# Patient Record
Sex: Female | Born: 1990 | Race: Black or African American | Hispanic: No | Marital: Single | Smoking: Never smoker
Health system: Southern US, Community
[De-identification: ages and names within clinical notes are randomized; demographics above are authoritative.]

---

## 2013-10-19 ENCOUNTER — Emergency Department (HOSPITAL_BASED_OUTPATIENT_CLINIC_OR_DEPARTMENT_OTHER)
Admission: EM | Admit: 2013-10-19 | Discharge: 2013-10-19 | Disposition: A | Discharge status: Home / Self Care | Payer: Self-pay | Attending: Emergency Medicine | Admitting: Emergency Medicine

## 2013-10-19 ENCOUNTER — Encounter (HOSPITAL_BASED_OUTPATIENT_CLINIC_OR_DEPARTMENT_OTHER): Payer: Self-pay | Admitting: Emergency Medicine

## 2013-10-19 DIAGNOSIS — K920 Hematemesis: Secondary | ICD-10-CM | POA: Insufficient documentation

## 2013-10-19 DIAGNOSIS — M545 Low back pain, unspecified: Secondary | ICD-10-CM | POA: Insufficient documentation

## 2013-10-19 DIAGNOSIS — R1110 Vomiting, unspecified: Secondary | ICD-10-CM

## 2013-10-19 DIAGNOSIS — Z3202 Encounter for pregnancy test, result negative: Secondary | ICD-10-CM | POA: Insufficient documentation

## 2013-10-19 LAB — URINALYSIS, ROUTINE W REFLEX MICROSCOPIC
Bilirubin Urine: NEGATIVE
Glucose, UA: NEGATIVE mg/dL
Hgb urine dipstick: NEGATIVE
Ketones, ur: NEGATIVE mg/dL
Leukocytes, UA: NEGATIVE
Nitrite: NEGATIVE
Protein, ur: NEGATIVE mg/dL
Specific Gravity, Urine: 1.012 (ref 1.005–1.030)
pH: 6.5 (ref 5.0–8.0)

## 2013-10-19 LAB — PREGNANCY, URINE: Preg Test, Ur: NEGATIVE

## 2013-10-19 MED ORDER — PANTOPRAZOLE SODIUM 40 MG PO TBEC
40.0000 mg | DELAYED_RELEASE_TABLET | Freq: Once | ORAL | Status: AC
  Administered 2013-10-19: 40 mg via ORAL
  Filled 2013-10-19: qty 1

## 2013-10-19 MED ORDER — PANTOPRAZOLE SODIUM 40 MG PO TBEC: 40.0000 mg | DELAYED_RELEASE_TABLET | Freq: Every day | ORAL | Status: DC

## 2013-10-19 MED ORDER — ONDANSETRON 4 MG PO TBDP
4.0000 mg | ORAL_TABLET | Freq: Once | ORAL | Status: AC
  Administered 2013-10-19: 4 mg via ORAL
  Filled 2013-10-19: qty 1

## 2013-10-19 MED ORDER — ONDANSETRON 4 MG PO TBDP: 4.0000 mg | ORAL_TABLET | Freq: Three times a day (TID) | ORAL | Status: DC | PRN

## 2013-10-19 NOTE — ED Notes (Addendum)
Vomited x2 at work at 5am this morning.  Denies abd pain.  Vomited last at 7am. Has not eaten. Sts after she vomited she felt better.

## 2013-10-19 NOTE — ED Provider Notes (Signed)
CSN: [id number]     Arrival date & time 10/19/13  1028 History   First MD Initiated Contact with Patient 10/19/13 1031     Chief Complaint  Patient presents with  . Emesis   (Consider location/radiation/quality/duration/timing/severity/associated sxs/prior Treatment) HPI Comments: Has been taking NSAIDs twice daily for the past month for her lower back problems.  Patient is a 22 y.o. female presenting with vomiting. The history is provided by the patient.  Emesis Severity:  Mild Timing:  Sporadic Number of daily episodes:  2 Quality:  Stomach contents and bright red blood (small amount of bright red blood with each episode of vomiting today) Progression:  Resolved Chronicity:  New Recent urination:  Normal Context: not post-tussive and not self-induced   Relieved by:  Nothing Worsened by:  Nothing tried Associated symptoms: no abdominal pain, no chills, no cough, no diarrhea, no fever, no myalgias, no sore throat and no URI   Risk factors: no alcohol use, no sick contacts and no suspect food intake     History reviewed. No pertinent past medical history. History reviewed. No pertinent past surgical history. No family history on file. History  Substance Use Topics  . Smoking status: Never Smoker   . Smokeless tobacco: Not on file  . Alcohol Use: Yes     Comment: occ   OB History   Grav Para Term Preterm Abortions TAB SAB Ect Mult Living                 Review of Systems  Constitutional: Negative for chills.  HENT: Negative for sore throat.   Gastrointestinal: Positive for vomiting. Negative for abdominal pain and diarrhea.  Musculoskeletal: Negative for myalgias.  All other systems reviewed and are negative.    Allergies  Review of patient's allergies indicates no known allergies.  Home Medications  No current outpatient prescriptions on file. BP 145/83  Pulse 68  Temp(Src) 98 F (36.7 C) (Oral)  Resp 16  Ht 5\' 4"  (1.626 m)  Wt 180 lb (81.647 kg)  BMI  30.88 kg/m2  SpO2 100%  LMP 10/13/2013 Physical Exam  Nursing note and vitals reviewed. Constitutional: She is oriented to person, place, and time. She appears well-developed and well-nourished. No distress.  HENT:  Head: Normocephalic and atraumatic.  Eyes: EOM are normal. Pupils are equal, round, and reactive to light.  Neck: Normal range of motion. Neck supple.  Cardiovascular: Normal rate and regular rhythm.  Exam reveals no friction rub.   No murmur heard. Pulmonary/Chest: Effort normal and breath sounds normal. No respiratory distress. She has no wheezes. She has no rales.  Abdominal: Soft. She exhibits no distension. There is no tenderness. There is no rebound.  Musculoskeletal: Normal range of motion. She exhibits no edema.  Neurological: She is alert and oriented to person, place, and time.  Skin: She is not diaphoretic.    ED Course  Procedures (including critical care time) Labs Review Labs Reviewed  PREGNANCY, URINE  URINALYSIS, ROUTINE W REFLEX MICROSCOPIC   Imaging Review No results found.  EKG Interpretation   None       MDM   1. Vomiting   2. Hematemesis    22 year old female presents with 2 episodes of vomiting. She has small amount of bright red blood in each episode of vomiting. She's feeling better now. It came on out of nowhere all she was working. She's been taking NSAIDs multiple times daily for the past month due to lower back pain. Here her vitals are  stable. She is in normal exam. She denies any abdominal pain. Her lungs are clear and her chest wall is without any subcutaneous emphysema. I believe patient's mild hematemesis secondary to gastritis from NSAID use. I will give her Zofran here and a two-week prescription for Protonix. Also advised her to lay off the NSAIDs while she's taking the Protonix and switch to Tylenol.    Dagmar Hait, MD 10/19/13 1224

## 2014-03-17 ENCOUNTER — Encounter (HOSPITAL_BASED_OUTPATIENT_CLINIC_OR_DEPARTMENT_OTHER): Payer: Self-pay | Admitting: Emergency Medicine

## 2014-03-17 ENCOUNTER — Emergency Department (HOSPITAL_BASED_OUTPATIENT_CLINIC_OR_DEPARTMENT_OTHER)
Admission: EM | Admit: 2014-03-17 | Discharge: 2014-03-17 | Disposition: A | Discharge status: Home / Self Care | Payer: Self-pay | Attending: Emergency Medicine | Admitting: Emergency Medicine

## 2014-03-17 DIAGNOSIS — N939 Abnormal uterine and vaginal bleeding, unspecified: Secondary | ICD-10-CM

## 2014-03-17 DIAGNOSIS — J069 Acute upper respiratory infection, unspecified: Secondary | ICD-10-CM | POA: Insufficient documentation

## 2014-03-17 DIAGNOSIS — Z3202 Encounter for pregnancy test, result negative: Secondary | ICD-10-CM | POA: Insufficient documentation

## 2014-03-17 DIAGNOSIS — N898 Other specified noninflammatory disorders of vagina: Secondary | ICD-10-CM | POA: Insufficient documentation

## 2014-03-17 LAB — URINALYSIS, ROUTINE W REFLEX MICROSCOPIC
Glucose, UA: NEGATIVE mg/dL
KETONES UR: 15 mg/dL — AB
LEUKOCYTES UA: NEGATIVE
Nitrite: NEGATIVE
PROTEIN: NEGATIVE mg/dL
Specific Gravity, Urine: 1.031 — ABNORMAL HIGH (ref 1.005–1.030)
Urobilinogen, UA: 1 mg/dL (ref 0.0–1.0)
pH: 5.5 (ref 5.0–8.0)

## 2014-03-17 LAB — WET PREP, GENITAL
TRICH WET PREP: NONE SEEN
YEAST WET PREP: NONE SEEN

## 2014-03-17 LAB — PREGNANCY, URINE: Preg Test, Ur: NEGATIVE

## 2014-03-17 LAB — URINE MICROSCOPIC-ADD ON

## 2014-03-17 NOTE — Discharge Instructions (Signed)
Dysfunctional Uterine Bleeding Normally, menstrual periods begin between ages 16 to 68 in young women. A normal menstrual cycle/period may begin every 23 days up to 35 days and lasts from 1 to 7 days. Around 12 to 14 days before your menstrual period starts, ovulation (ovary produces an egg) occurs. When counting the time between menstrual periods, count from the first day of bleeding of the previous period to the first day of bleeding of the next period. Dysfunctional (abnormal) uterine bleeding is bleeding that is different from a normal menstrual period. Your periods may come earlier or later than usual. They may be lighter, have blood clots or be heavier. You may have bleeding between periods, or you may skip one period or more. You may have bleeding after sexual intercourse, bleeding after menopause, or no menstrual period. CAUSES   Pregnancy (normal, miscarriage, tubal).  IUDs (intrauterine device, birth control).  Birth control pills.  Hormone treatment.  Menopause.  Infection of the cervix.  Blood clotting problems.  Infection of the inside lining of the uterus.  Endometriosis, inside lining of the uterus growing in the pelvis and other female organs.  Adhesions (scar tissue) inside the uterus.  Obesity or severe weight loss.  Uterine polyps inside the uterus.  Cancer of the vagina, cervix, or uterus.  Ovarian cysts or polycystic ovary syndrome.  Medical problems (diabetes, thyroid disease).  Uterine fibroids (noncancerous tumor).  Problems with your female hormones.  Endometrial hyperplasia, very thick lining and enlarged cells inside of the uterus.  Medicines that interfere with ovulation.  Radiation to the pelvis or abdomen.  Chemotherapy. DIAGNOSIS   Your doctor will discuss the history of your menstrual periods, medicines you are taking, changes in your weight, stress in your life, and any medical problems you may have.  Your doctor will do a physical  and pelvic examination.  Your doctor may want to perform certain tests to make a diagnosis, such as:  Pap test.  Blood tests.  Cultures for infection.  CT scan.  Ultrasound.  Hysteroscopy.  Laparoscopy.  MRI.  Hysterosalpingography.  D and C.  Endometrial biopsy. TREATMENT  Treatment will depend on the cause of the dysfunctional uterine bleeding (DUB). Treatment may include:  Observing your menstrual periods for a couple of months.  Prescribing medicines for medical problems, including:  Antibiotics.  Hormones.  Birth control pills.  Removing an IUD (intrauterine device, birth control).  Surgery:  D and C (scrape and remove tissue from inside the uterus).  Laparoscopy (examine inside the abdomen with a lighted tube).  Uterine ablation (destroy lining of the uterus with electrical current, laser, heat, or freezing).  Hysteroscopy (examine cervix and uterus with a lighted tube).  Hysterectomy (remove the uterus). HOME CARE INSTRUCTIONS   If medicines were prescribed, take exactly as directed. Do not change or switch medicines without consulting your caregiver.  Long term heavy bleeding may result in iron deficiency. Your caregiver may have prescribed iron pills. They help replace the iron that your body lost from heavy bleeding. Take exactly as directed.  Do not take aspirin or medicines that contain aspirin one week before or during your menstrual period. Aspirin may make the bleeding worse.  If you need to change your sanitary pad or tampon more than once every 2 hours, stay in bed with your feet elevated and a cold pack on your lower abdomen. Rest as much as possible, until the bleeding stops or slows down.  Eat well-balanced meals. Eat foods high in iron. Examples  are:  Leafy green vegetables.  Whole-grain breads and cereals.  Eggs.  Meat.  Liver.  Do not try to lose weight until the abnormal bleeding has stopped and your blood iron level is  back to normal. Do not lift more than ten pounds or do strenuous activities when you are bleeding.  For a couple of months, make note on your calendar, marking the start and ending of your period, and the type of bleeding (light, medium, heavy, spotting, clots or missed periods). This is for your caregiver to better evaluate your problem. SEEK MEDICAL CARE IF:   You develop nausea (feeling sick to your stomach) and vomiting, dizziness, or diarrhea while you are taking your medicine.  You are getting lightheaded or weak.  You have any problems that may be related to the medicine you are taking.  You develop pain with your DUB.  You want to remove your IUD.  You want to stop or change your birth control pills or hormones.  You have any type of abnormal bleeding mentioned above.  You are over 25 years old and have not had a menstrual period yet.  You are 23 years old and you are still having menstrual periods.  You have any of the symptoms mentioned above.  You develop a rash. SEEK IMMEDIATE MEDICAL CARE IF:   An oral temperature above 102 F (38.9 C) develops.  You develop chills.  You are changing your sanitary pad or tampon more than once an hour.  You develop abdominal pain.  You pass out or faint. Document Released: 10/25/2000 Document Revised: 01/20/2012 Document Reviewed: 09/26/2009 [facility] Patient Information 2014 [place]ExitCare, Maryland[place].  Upper Respiratory Infection, Adult An upper respiratory infection (URI) is also sometimes known as the common cold. The upper respiratory tract includes the nose, sinuses, throat, trachea, and bronchi. Bronchi are the airways leading to the lungs. Most people improve within 1 week, but symptoms can last up to 2 weeks. A residual cough may last even longer.  CAUSES Many different viruses can infect the tissues lining the upper respiratory tract. The tissues become irritated and inflamed and often become very moist. Mucus production is also  common. A cold is contagious. You can easily spread the virus to others by oral contact. This includes kissing, sharing a glass, coughing, or sneezing. Touching your mouth or nose and then touching a surface, which is then touched by another person, can also spread the virus. SYMPTOMS  Symptoms typically develop 1 to 3 days after you come in contact with a cold virus. Symptoms vary from person to person. They may include:  Runny nose.  Sneezing.  Nasal congestion.  Sinus irritation.  Sore throat.  Loss of voice (laryngitis).  Cough.  Fatigue.  Muscle aches.  Loss of appetite.  Headache.  Low-grade fever. DIAGNOSIS  You might diagnose your own cold based on familiar symptoms, since most people get a cold 2 to 3 times a year. Your caregiver can confirm this based on your exam. Most importantly, your caregiver can check that your symptoms are not due to another disease such as strep throat, sinusitis, pneumonia, asthma, or epiglottitis. Blood tests, throat tests, and X-rays are not necessary to diagnose a common cold, but they may sometimes be helpful in excluding other more serious diseases. Your caregiver will decide if any further tests are required. RISKS AND COMPLICATIONS  You may be at risk for a more severe case of the common cold if you smoke cigarettes, have chronic heart disease (such  as heart failure) or lung disease (such as asthma), or if you have a weakened immune system. The very young and very old are also at risk for more serious infections. Bacterial sinusitis, middle ear infections, and bacterial pneumonia can complicate the common cold. The common cold can worsen asthma and chronic obstructive pulmonary disease (COPD). Sometimes, these complications can require emergency medical care and may be life-threatening. PREVENTION  The best way to protect against getting a cold is to practice good hygiene. Avoid oral or hand contact with people with cold symptoms. Wash your  hands often if contact occurs. There is no clear evidence that vitamin C, vitamin E, echinacea, or exercise reduces the chance of developing a cold. However, it is always recommended to get plenty of rest and practice good nutrition. TREATMENT  Treatment is directed at relieving symptoms. There is no cure. Antibiotics are not effective, because the infection is caused by a virus, not by bacteria. Treatment may include:  Increased fluid intake. Sports drinks offer valuable electrolytes, sugars, and fluids.  Breathing heated mist or steam (vaporizer or shower).  Eating chicken soup or other clear broths, and maintaining good nutrition.  Getting plenty of rest.  Using gargles or lozenges for comfort.  Controlling fevers with ibuprofen or acetaminophen as directed by your caregiver.  Increasing usage of your inhaler if you have asthma. Zinc gel and zinc lozenges, taken in the first 24 hours of the common cold, can shorten the duration and lessen the severity of symptoms. Pain medicines may help with fever, muscle aches, and throat pain. A variety of non-prescription medicines are available to treat congestion and runny nose. Your caregiver can make recommendations and may suggest nasal or lung inhalers for other symptoms.  HOME CARE INSTRUCTIONS   Only take over-the-counter or prescription medicines for pain, discomfort, or fever as directed by your caregiver.  Use a warm mist humidifier or inhale steam from a shower to increase air moisture. This may keep secretions moist and make it easier to breathe.  Drink enough water and fluids to keep your urine clear or pale yellow.  Rest as needed.  Return to work when your temperature has returned to normal or as your caregiver advises. You may need to stay home longer to avoid infecting others. You can also use a face mask and careful hand washing to prevent spread of the virus. SEEK MEDICAL CARE IF:   After the first few days, you feel you are  getting worse rather than better.  You need your caregiver's advice about medicines to control symptoms.  You develop chills, worsening shortness of breath, or brown or red sputum. These may be signs of pneumonia.  You develop yellow or brown nasal discharge or pain in the face, especially when you bend forward. These may be signs of sinusitis.  You develop a fever, swollen neck glands, pain with swallowing, or white areas in the back of your throat. These may be signs of strep throat. SEEK IMMEDIATE MEDICAL CARE IF:   You have a fever.  You develop severe or persistent headache, ear pain, sinus pain, or chest pain.  You develop wheezing, a prolonged cough, cough up blood, or have a change in your usual mucus (if you have chronic lung disease).  You develop sore muscles or a stiff neck. Document Released: 04/23/2001 Document Revised: 01/20/2012 Document Reviewed: 03/01/2011 [facility]ExitCare Patient Information 2014 [place]idgeExitCare, MarylandLLC.

## 2014-03-17 NOTE — ED Notes (Signed)
Vaginal bleeding for 2 months.

## 2014-03-17 NOTE — ED Provider Notes (Signed)
CSN: [id number]     Arrival date & time 03/17/14  1450 History   First MD Initiated Contact with Patient 03/17/14 1649     Chief Complaint  Patient presents with  . Vaginal Bleeding     (Consider location/radiation/quality/duration/timing/severity/associated sxs/prior Treatment) Patient is a 23 y.o. female presenting with vaginal bleeding.  Vaginal Bleeding Quality:  Clots and dark red Severity:  Moderate Onset quality:  Gradual Duration:  2 months Timing:  Constant Progression:  Unchanged Chronicity:  Recurrent Menstrual history:  Irregular Number of tampons used:  2 per day Context: spontaneously   Relieved by:  Nothing Worsened by:  Nothing tried Associated symptoms: no abdominal pain, no dysuria, no fever, no nausea and no vaginal discharge     History reviewed. No pertinent past medical history. History reviewed. No pertinent past surgical history. No family history on file. History  Substance Use Topics  . Smoking status: Never Smoker   . Smokeless tobacco: Not on file  . Alcohol Use: Yes     Comment: occ   OB History   Grav Para Term Preterm Abortions TAB SAB Ect Mult Living                 Review of Systems  Constitutional: Negative for fever.  Gastrointestinal: Negative for nausea and abdominal pain.  Genitourinary: Positive for vaginal bleeding. Negative for dysuria and vaginal discharge.  All other systems reviewed and are negative.     Allergies  Review of patient's allergies indicates no known allergies.  Home Medications   Prior to Admission medications   Medication Sig Start Date End Date Taking? Authorizing Provider  ondansetron (ZOFRAN-ODT) 4 MG disintegrating tablet Take 1 tablet (4 mg total) by mouth every 8 (eight) hours as needed for nausea or vomiting. 10/19/13   Dagmar HaitWilliam Blair Walden, MD  pantoprazole (PROTONIX) 40 MG tablet Take 1 tablet (40 mg total) by mouth daily. 10/19/13   Dagmar HaitWilliam Blair Walden, MD   BP 139/89  Pulse 89  Temp(Src)  99.7 F (37.6 C) (Oral)  Resp 16  Wt 180 lb (81.647 kg)  SpO2 100%  LMP 01/15/2014 Physical Exam  Nursing note and vitals reviewed. Constitutional: She is oriented to person, place, and time. She appears well-developed and well-nourished. No distress.  HENT:  Head: Normocephalic and atraumatic.  Mouth/Throat: Posterior oropharyngeal erythema present. No oropharyngeal exudate, posterior oropharyngeal edema or tonsillar abscesses.  Eyes: Conjunctivae are normal. Pupils are equal, round, and reactive to light. No scleral icterus.  Neck: Neck supple.  Cardiovascular: Normal rate, regular rhythm, normal heart sounds and intact distal pulses.   No murmur heard. Pulmonary/Chest: Effort normal and breath sounds normal. No stridor. No respiratory distress. She has no rales.  Abdominal: Soft. Bowel sounds are normal. She exhibits no distension. There is no tenderness.  Genitourinary: There is no rash or tenderness on the right labia. There is no rash or tenderness on the left labia. Uterus is not enlarged and not tender. Cervix exhibits no motion tenderness, no discharge and no friability. Right adnexum displays no mass and no tenderness. Left adnexum displays no mass and no tenderness. There is bleeding around the vagina. No signs of injury around the vagina. No vaginal discharge found.  Musculoskeletal: Normal range of motion.  Neurological: She is alert and oriented to person, place, and time.  Skin: Skin is warm and dry. No rash noted.  Psychiatric: She has a normal mood and affect. Her behavior is normal.    ED Course  Procedures (including  critical care time) Labs Review Labs Reviewed  WET PREP, GENITAL - Abnormal; Notable for the following:    Clue Cells Wet Prep HPF POC MANY (*)    WBC, Wet Prep HPF POC FEW (*)    All other components within normal limits  URINALYSIS, ROUTINE W REFLEX MICROSCOPIC - Abnormal; Notable for the following:    APPearance CLOUDY (*)    Specific Gravity,  Urine 1.031 (*)    Hgb urine dipstick MODERATE (*)    Bilirubin Urine SMALL (*)    Ketones, ur 15 (*)    All other components within normal limits  URINE MICROSCOPIC-ADD ON - Abnormal; Notable for the following:    Squamous Epithelial / LPF FEW (*)    All other components within normal limits  GC/CHLAMYDIA PROBE AMP  PREGNANCY, URINE    Imaging Review No results found.   EKG Interpretation None      MDM   Final diagnoses:  Vaginal bleeding  Viral URI    Vaginal bleeding for two months without pain.  No signs or symptoms of significant blood loss.  Will refer to gynecology.    Also complains of URI symptoms at discharge to request work note.  Well appearing, mild posterior pharyngeal erythema.  Likely has viral URI.  Advised return precautions.      Candyce ChurnJohn David Tarry Fountain III, MD 03/18/14 Marlyne [email or web address]

## 2014-03-17 NOTE — ED Notes (Signed)
States has had vag bleeding x 2 months. Cycles have always been irregular.  Started having cycles for 2 months in Sept

## 2014-03-18 LAB — GC/CHLAMYDIA PROBE AMP
CT Probe RNA: NEGATIVE
GC Probe RNA: NEGATIVE

## 2016-01-05 ENCOUNTER — Emergency Department (HOSPITAL_BASED_OUTPATIENT_CLINIC_OR_DEPARTMENT_OTHER)
Admission: EM | Admit: 2016-01-05 | Discharge: 2016-01-05 | Disposition: A | Discharge status: Home / Self Care | Payer: Self-pay | Attending: Emergency Medicine | Admitting: Emergency Medicine

## 2016-01-05 ENCOUNTER — Encounter (HOSPITAL_BASED_OUTPATIENT_CLINIC_OR_DEPARTMENT_OTHER): Payer: Self-pay | Admitting: *Deleted

## 2016-01-05 DIAGNOSIS — Z79899 Other long term (current) drug therapy: Secondary | ICD-10-CM | POA: Insufficient documentation

## 2016-01-05 DIAGNOSIS — J029 Acute pharyngitis, unspecified: Secondary | ICD-10-CM | POA: Insufficient documentation

## 2016-01-05 LAB — RAPID STREP SCREEN (MED CTR MEBANE ONLY): Streptococcus, Group A Screen (Direct): NEGATIVE

## 2016-01-05 NOTE — ED Provider Notes (Signed)
CSN: [id number]     Arrival date & time 01/05/16  1659 History   First MD Initiated Contact with Patient 01/05/16 1742     Chief Complaint  Patient presents with  . Sore Throat     (Consider location/radiation/quality/duration/timing/severity/associated sxs/prior Treatment) HPI Cathy Berger is a 25 y.o. female who comes in for evaluation of gradual onset sore throat over the past 4 days. She reports swallowing is painful. She denies any difficulties moving her jaw, cough, fevers, source of breath, chest pain. She does report her brother is sick with similar symptoms. She has tried Chloraseptic spray without relief. No other alleviating or modifying factors.  History reviewed. No pertinent past medical history. History reviewed. No pertinent past surgical history. No family history on file. Social History  Substance Use Topics  . Smoking status: Never Smoker   . Smokeless tobacco: Never Used  . Alcohol Use: No     Comment: occ   OB History    No data available     Review of Systems A 10 point review of systems was completed and was negative except for pertinent positives and negatives as mentioned in the history of present illness     Allergies  Review of patient's allergies indicates no known allergies.  Home Medications   Prior to Admission medications   Medication Sig Start Date End Date Taking? Authorizing Provider  ondansetron (ZOFRAN-ODT) 4 MG disintegrating tablet Take 1 tablet (4 mg total) by mouth every 8 (eight) hours as needed for nausea or vomiting. 10/19/13   Elwin Mocha, MD  pantoprazole (PROTONIX) 40 MG tablet Take 1 tablet (40 mg total) by mouth daily. 10/19/13   Elwin Mocha, MD   BP 135/92 mmHg  Pulse 66  Temp(Src) 98.1 F (36.7 C) (Oral)  Resp 20  Ht  (1.626 m)  Wt 99.791 kg  BMI 37.74 kg/m2  SpO2 100% Physical Exam  Constitutional:  Awake, alert, nontoxic appearance.  HENT:  Head: Atraumatic.  Mildly erythematous posterior oropharynx  with no tonsillar swelling or exudate. No trismus.  Eyes: Right eye exhibits no discharge. Left eye exhibits no discharge.  Neck: Normal range of motion. Neck supple.  Cardiovascular: Normal rate and regular rhythm.   Pulmonary/Chest: Effort normal. She exhibits no tenderness.  Abdominal: Soft. There is no tenderness. There is no rebound.  Musculoskeletal: She exhibits no tenderness.  Baseline ROM, no obvious new focal weakness.  Lymphadenopathy:    She has no cervical adenopathy.  Neurological:  Mental status and motor strength appears baseline for patient and situation.  Skin: No rash noted.  Psychiatric: She has a normal mood and affect.  Nursing note and vitals reviewed.   ED Course  Procedures (including critical care time) Labs Review Labs Reviewed  RAPID STREP SCREEN (NOT AT [facility])  CULTURE, GROUP A STREP [facility])    Imaging Review No results found. I have personally reviewed and evaluated these images and lab results as part of my medical decision-making.   EKG Interpretation None     Meds given in ED:  Medications - No data to display  Discharge Medication List as of 01/05/2016  6:01 PM     Filed Vitals:   01/05/16 1705 01/05/16 1804  BP: 168/97 135/92  Pulse: 74 66  Temp: 98.4 F (36.9 C) 98.1 F (36.7 C)  TempSrc: Oral Oral  Resp: 16 20  Height:  (1.626 m)   Weight: 99.791 kg   SpO2: 100% 100%    MDM  Pt afebrile  without tonsillar exudate, cervical adenopathy. Centor criteria are 1. Diagnosis of viral pharyngitis. No abx indicated. DC w symptomatic tx for pain  Pt does not appear dehydrated, but did discuss importance of water rehydration. Presentation non concerning for PTA or infxn spread to soft tissue. No trismus or uvula deviation. Specific return precautions discussed. Pt able to drink water in ED without difficulty with intact air way. Recommended PCP follow up.  Final diagnoses:  Viral pharyngitis        Joycie Peek,  PA-C 01/05/16 1835  Marily Memos, MD 01/06/16 [phone]

## 2016-01-05 NOTE — Discharge Instructions (Signed)
Your sore throat is likely due to a virus and will resolve on its own over the next week or week and a half. You may try Cepacol lozenges to help with your discomfort. You may also utilize salt water gargles. Follow-up with your doctor as needed. Return to ED for worsening symptoms.  Pharyngitis Pharyngitis is redness, pain, and swelling (inflammation) of your pharynx.  CAUSES  Pharyngitis is usually caused by infection. Most of the time, these infections are from viruses (viral) and are part of a cold. However, sometimes pharyngitis is caused by bacteria (bacterial). Pharyngitis can also be caused by allergies. Viral pharyngitis may be spread from person to person by coughing, sneezing, and personal items or utensils (cups, forks, spoons, toothbrushes). Bacterial pharyngitis may be spread from person to person by more intimate contact, such as kissing.  SIGNS AND SYMPTOMS  Symptoms of pharyngitis include:   Sore throat.   Tiredness (fatigue).   Low-grade fever.   Headache.  Joint pain and muscle aches.  Skin rashes.  Swollen lymph nodes.  Plaque-like film on throat or tonsils (often seen with bacterial pharyngitis). DIAGNOSIS  Your health care provider will ask you questions about your illness and your symptoms. Your medical history, along with a physical exam, is often all that is needed to diagnose pharyngitis. Sometimes, a rapid strep test is done. Other lab tests may also be done, depending on the suspected cause.  TREATMENT  Viral pharyngitis will usually get better in 3-4 days without the use of medicine. Bacterial pharyngitis is treated with medicines that kill germs (antibiotics).  HOME CARE INSTRUCTIONS   Drink enough water and fluids to keep your urine clear or pale yellow.   Only take over-the-counter or prescription medicines as directed by your health care provider:   If you are prescribed antibiotics, make sure you finish them even if you start to feel better.    Do not take aspirin.   Get lots of rest.   Gargle with 8 oz of salt water ( tsp of salt per 1 qt of water) as often as every 1-2 hours to soothe your throat.   Throat lozenges (if you are not at risk for choking) or sprays may be used to soothe your throat. SEEK MEDICAL CARE IF:   You have large, tender lumps in your neck.  You have a rash.  You cough up green, yellow-brown, or bloody spit. SEEK IMMEDIATE MEDICAL CARE IF:   Your neck becomes stiff.  You drool or are unable to swallow liquids.  You vomit or are unable to keep medicines or liquids down.  You have severe pain that does not go away with the use of recommended medicines.  You have trouble breathing (not caused by a stuffy nose). MAKE SURE YOU:   Understand these instructions.  Will watch your condition.  Will get help right away if you are not doing well or get worse.   This information is not intended to replace advice given to you by your health care provider. Make sure you discuss any questions you have with your health care provider.   Document Released: 10/28/2005 Document Revised: 08/18/2013 Document Reviewed: 07/05/2013 Elsevier Interactive Patient Education [employer]

## 2016-01-05 NOTE — ED Notes (Signed)
Sore throat x 4 days- denies fever- painful to swallow

## 2016-01-08 LAB — CULTURE, GROUP A STREP (THRC)

## 2016-07-16 ENCOUNTER — Encounter (HOSPITAL_BASED_OUTPATIENT_CLINIC_OR_DEPARTMENT_OTHER): Payer: Self-pay

## 2016-07-16 ENCOUNTER — Emergency Department (HOSPITAL_BASED_OUTPATIENT_CLINIC_OR_DEPARTMENT_OTHER)
Admission: EM | Admit: 2016-07-16 | Discharge: 2016-07-16 | Disposition: A | Discharge status: Home / Self Care | Payer: Self-pay | Attending: Emergency Medicine | Admitting: Emergency Medicine

## 2016-07-16 DIAGNOSIS — F17210 Nicotine dependence, cigarettes, uncomplicated: Secondary | ICD-10-CM | POA: Insufficient documentation

## 2016-07-16 DIAGNOSIS — N939 Abnormal uterine and vaginal bleeding, unspecified: Secondary | ICD-10-CM | POA: Insufficient documentation

## 2016-07-16 LAB — URINALYSIS, ROUTINE W REFLEX MICROSCOPIC
Bilirubin Urine: NEGATIVE
Glucose, UA: NEGATIVE mg/dL
KETONES UR: NEGATIVE mg/dL
LEUKOCYTES UA: NEGATIVE
Nitrite: NEGATIVE
Protein, ur: NEGATIVE mg/dL
Specific Gravity, Urine: 1.019 (ref 1.005–1.030)
pH: 6.5 (ref 5.0–8.0)

## 2016-07-16 LAB — URINE MICROSCOPIC-ADD ON

## 2016-07-16 LAB — PREGNANCY, URINE: PREG TEST UR: NEGATIVE

## 2016-07-16 LAB — WET PREP, GENITAL
Sperm: NONE SEEN
Trich, Wet Prep: NONE SEEN
Yeast Wet Prep HPF POC: NONE SEEN

## 2016-07-16 MED ORDER — MEDROXYPROGESTERONE ACETATE 5 MG PO TABS: 5.0000 mg | ORAL_TABLET | Freq: Every day | ORAL | 0 refills | Status: DC

## 2016-07-16 MED FILL — MEDROXYPROGESTERONE 5 MG TA: 5 | 5 days supply | Qty: 5 | Fill #0

## 2016-07-16 NOTE — ED Provider Notes (Signed)
[facility]   CSN: [id number] Arrival date & time: 07/16/16  1320     History   Chief Complaint Chief Complaint  Patient presents with  . Vaginal Bleeding    HPI Cathy Berger is a 25 y.o. female.  The history is provided by the patient.  Vaginal Bleeding  Primary symptoms include vaginal bleeding.  Primary symptoms include no discharge, no pelvic pain, no dyspareunia, no dysuria. There has been no fever. This is a new problem. Episode onset: has had persistent bleeding for the last 2 months.  sometimes heavy and sometimes spotting.  this has never happened before. The problem occurs constantly. The problem has not changed since onset.The symptoms occur spontaneously. She is not pregnant. She has not missed her period. LMP: last normal period was in june and then started bleeding in july and has not stopped. The patient's menstrual history has been regular. The discharge was bloody. Pertinent negatives include no abdominal swelling, no abdominal pain, no diarrhea, no nausea and no vomiting. Associated symptoms comments: States when bleeding is heavy she will be mildy weak and dizzy but no constantly and not now. She has tried nothing for the symptoms. The treatment provided no relief. Sexual activity: sexually active. There is no concern regarding sexually transmitted diseases. She uses nothing for contraception. Associated medical issues do not include STD, vaginosis, ovarian cysts, endometriosis or ectopic pregnancy. Associated medical issues comments: no OCP use or depo/iud/implanon at this time.  no prior hx of irregular menses.    History reviewed. No pertinent past medical history.  There are no active problems to display for this patient.   History reviewed. No pertinent surgical history.  OB History    No data available       Home Medications    Prior to Admission medications   Not on File    Family History No family history on  file.  Social History Social History  Substance Use Topics  . Smoking status: Current Some Day Smoker    Types: Cigars  . Smokeless tobacco: Never Used  . Alcohol use Yes     Comment: occ     Allergies   Review of patient's allergies indicates no known allergies.   Review of Systems Review of Systems  Gastrointestinal: Negative for abdominal pain, diarrhea, nausea and vomiting.  Genitourinary: Positive for vaginal bleeding. Negative for dyspareunia, dysuria and pelvic pain.  All other systems reviewed and are negative.    Physical Exam Updated Vital Signs BP 140/90 (BP Location: Left Arm)   Pulse 79   Temp 98.3 F (36.8 C) (Oral)   Resp 16   Ht 5\' 4"  (1.626 m)   Wt 223 lb (101.2 kg)   SpO2 100%   BMI 38.28 kg/m   Physical Exam  Constitutional: She is oriented to person, place, and time. She appears well-developed and well-nourished. No distress.  HENT:  Head: Normocephalic and atraumatic.  Mouth/Throat: Oropharynx is clear and moist.  Eyes: Conjunctivae and EOM are normal. Pupils are equal, round, and reactive to light.  Neck: Normal range of motion. Neck supple.  Cardiovascular: Normal rate, regular rhythm and intact distal pulses.   No murmur heard. Pulmonary/Chest: Effort normal and breath sounds normal. No respiratory distress. She has no wheezes. She has no rales.  Abdominal: Soft. She exhibits no distension. There is no tenderness. There is no rebound and no guarding.  Genitourinary: Uterus normal. Cervix exhibits no motion tenderness, no discharge and no friability. Right adnexum  displays no mass, no tenderness and no fullness. Left adnexum displays no mass, no tenderness and no fullness. There is bleeding in the vagina. No vaginal discharge found.  Musculoskeletal: Normal range of motion. She exhibits no edema or tenderness.  Neurological: She is alert and oriented to person, place, and time.  Skin: Skin is warm and dry. No rash noted. No erythema. No  pallor.  Psychiatric: She has a normal mood and affect. Her behavior is normal.  Nursing note and vitals reviewed.    ED Treatments / Results  Labs (all labs ordered are listed, but only abnormal results are displayed) Labs Reviewed  WET PREP, GENITAL - Abnormal; Notable for the following:       Result Value   Clue Cells Wet Prep HPF POC PRESENT (*)    WBC, Wet Prep HPF POC FEW (*)    All other components within normal limits  URINALYSIS, ROUTINE W REFLEX MICROSCOPIC (NOT AT [facility]) - Abnormal; Notable for the following:    Hgb urine dipstick MODERATE (*)    All other components within normal limits  URINE MICROSCOPIC-ADD ON - Abnormal; Notable for the following:    Squamous Epithelial / LPF 0-5 (*)    Bacteria, UA FEW (*)    All other components within normal limits  PREGNANCY, URINE  GC/CHLAMYDIA PROBE AMP ([place]) NOT AT [facility]    EKG  EKG Interpretation None       Radiology No results found.  Procedures Procedures (including critical care time)  Medications Ordered in ED Medications - No data to display   Initial Impression / Assessment and Plan / ED Course  I have reviewed the triage vital signs and the nursing notes.  Pertinent labs & imaging results that were available during my care of the patient were reviewed by me and considered in my medical decision making (see chart for details).  Clinical Course   Patient presenting with 2 months of vaginal bleeding which as been waxing and waning in severity. Some day she only needs to use a pantiliner whereas other days she goes through multiple tampons. No prior history of dysfunctional uterine bleeding. She denies any birth control at any point in time. No recent procedures and denies pregnancy. She is sexually active and he's protection but denies any discharge itching or burning. She occasionally when blood flow is heavy feel generally weak and lightheaded but denies his symptoms currently. Conjunctivae are  normal in color with normal heart rate and blood pressure only mildly elevated. Low suspicion for significant anemia at this time. On exam patient does have mild vaginal bleeding with otherwise normal anatomy. STD testing done however low suspicion. No abdominal pain and no urinary symptoms. UPT is negative.  Wet prep clue cells only. We'll discharge home with Provera and follow-up with women[facility] clinic  Final Clinical Impressions(s) / ED Diagnoses   Final diagnoses:  Abnormal uterine bleeding (AUB)    New Prescriptions New Prescriptions   MEDROXYPROGESTERONE (PROVERA) 5 MG TABLET    Take 1 tablet (5 mg total) by mouth daily.     Gwyneth Sprout, MD 07/16/16 [phone]

## 2016-07-16 NOTE — ED Triage Notes (Signed)
C/o vaginal bleeding x 2 months-intermittent lightheaded-NAD-steady gait

## 2016-07-17 LAB — GC/CHLAMYDIA PROBE AMP (~~LOC~~) NOT AT ARMC
CHLAMYDIA, DNA PROBE: NEGATIVE
NEISSERIA GONORRHEA: NEGATIVE

## 2018-11-13 ENCOUNTER — Emergency Department (HOSPITAL_BASED_OUTPATIENT_CLINIC_OR_DEPARTMENT_OTHER): Payer: Self-pay

## 2018-11-13 ENCOUNTER — Emergency Department (HOSPITAL_BASED_OUTPATIENT_CLINIC_OR_DEPARTMENT_OTHER)
Admission: EM | Admit: 2018-11-13 | Discharge: 2018-11-13 | Disposition: A | Discharge status: Home / Self Care | Payer: Self-pay | Attending: Emergency Medicine | Admitting: Emergency Medicine

## 2018-11-13 ENCOUNTER — Other Ambulatory Visit: Payer: Self-pay

## 2018-11-13 ENCOUNTER — Encounter (HOSPITAL_BASED_OUTPATIENT_CLINIC_OR_DEPARTMENT_OTHER): Payer: Self-pay

## 2018-11-13 DIAGNOSIS — J111 Influenza due to unidentified influenza virus with other respiratory manifestations: Secondary | ICD-10-CM | POA: Insufficient documentation

## 2018-11-13 DIAGNOSIS — B9789 Other viral agents as the cause of diseases classified elsewhere: Secondary | ICD-10-CM

## 2018-11-13 DIAGNOSIS — R69 Illness, unspecified: Secondary | ICD-10-CM

## 2018-11-13 DIAGNOSIS — J069 Acute upper respiratory infection, unspecified: Secondary | ICD-10-CM

## 2018-11-13 DIAGNOSIS — Z87891 Personal history of nicotine dependence: Secondary | ICD-10-CM | POA: Insufficient documentation

## 2018-11-13 LAB — GROUP A STREP BY PCR: Group A Strep by PCR: NOT DETECTED

## 2018-11-13 MED ORDER — IBUPROFEN 800 MG PO TABS: 800.0000 mg | ORAL_TABLET | Freq: Three times a day (TID) | ORAL | 0 refills | Status: DC | PRN

## 2018-11-13 MED ORDER — ACETAMINOPHEN-CODEINE 120-12 MG/5ML PO SOLN: 10.0000 mL | ORAL | 0 refills | Status: DC | PRN

## 2018-11-13 MED ORDER — GUAIFENESIN ER 1200 MG PO TB12: 1.0000 | ORAL_TABLET | Freq: Two times a day (BID) | ORAL | 0 refills | Status: DC

## 2018-11-13 NOTE — Discharge Instructions (Addendum)
Return here as needed.  Increase your fluid intake and rest as much as possible.  Your strep test was negative and your chest x-ray did not show any signs of pneumonia.

## 2018-11-13 NOTE — ED Triage Notes (Signed)
C/o flu like sx x 1 week-NAD-steady gait 

## 2018-11-13 NOTE — ED Provider Notes (Signed)
[facility] EMERGENCY DEPARTMENT Provider Note   CSN: [id number] Arrival date & time: 11/13/18  1420     History   Chief Complaint Chief Complaint  Patient presents with  . Cough    HPI Cathy Berger is a 28 y.o. female.  HPI Patient presents to the emergency department with cough, sore throat, body aches and chills over the last 2 days.  The patient states that nothing seems to make the condition better or worse.  She states she did not take any medications prior to arrival for symptoms.  Patient states that she did notice some blood mixed in with mucus when she coughed earlier today.  The patient denies chest pain, shortness of breath, headache,blurred vision, neck pain, weakness, numbness, dizziness, anorexia, edema, abdominal pain, nausea, vomiting, diarrhea, rash, back pain, dysuria, hematemesis, bloody stool, near syncope, or syncope. History reviewed. No pertinent past medical history.  There are no active problems to display for this patient.   History reviewed. No pertinent surgical history.   OB History   No obstetric history on file.      Home Medications    Prior to Admission medications   Not on File    Family History No family history on file.  Social History Social History   Tobacco Use  . Smoking status: Former Games developer  . Smokeless tobacco: Never Used  Substance Use Topics  . Alcohol use: Yes    Comment: occ  . Drug use: Yes    Types: Marijuana     Allergies   Patient has no known allergies.   Review of Systems Review of Systems  All other systems negative except as documented in the HPI. All pertinent positives and negatives as reviewed in the HPI. Physical Exam Updated Vital Signs BP (!) 142/93 (BP Location: Left Arm)   Pulse 80   Temp 98.3 F (36.8 C) (Oral)   Resp 18   Ht 5\' 4"  (1.626 m)   Wt 109.8 kg   LMP 11/07/2018   SpO2 100%   BMI 41.54 kg/m   Physical Exam Vitals signs and nursing note reviewed.    Constitutional:      General: She is not in acute distress.    Appearance: She is well-developed.  HENT:     Head: Normocephalic and atraumatic.  Eyes:     Pupils: Pupils are equal, round, and reactive to light.  Neck:     Musculoskeletal: Normal range of motion and neck supple.  Cardiovascular:     Rate and Rhythm: Normal rate and regular rhythm.     Heart sounds: Normal heart sounds. No murmur. No friction rub. No gallop.   Pulmonary:     Effort: Pulmonary effort is normal. No respiratory distress.     Breath sounds: Normal breath sounds. No wheezing or rhonchi.  Abdominal:     General: Bowel sounds are normal. There is no distension.     Palpations: Abdomen is soft.     Tenderness: There is no abdominal tenderness.  Skin:    General: Skin is warm and dry.     Capillary Refill: Capillary refill takes less than 2 seconds.     Findings: No erythema or rash.  Neurological:     General: No focal deficit present.     Mental Status: She is alert and oriented to person, place, and time.     Motor: No abnormal muscle tone.     Coordination: Coordination normal.  Psychiatric:  Mood and Affect: Mood normal.        Behavior: Behavior normal.      ED Treatments / Results  Labs (all labs ordered are listed, but only abnormal results are displayed) Labs Reviewed  GROUP A STREP BY PCR    EKG None  Radiology Dg Chest 2 View  Result Date: 11/13/2018 CLINICAL DATA:  Cough and congestion EXAM: CHEST - 2 VIEW COMPARISON:  None. FINDINGS: Lungs are clear. The heart size and pulmonary vascularity are normal. No adenopathy. No bone lesions. IMPRESSION: No edema or consolidation. Electronically Signed   By: Bretta BangWilliam  Woodruff III M.D.   On: 11/13/2018 15:28    Procedures Procedures (including critical care time)  Medications Ordered in ED Medications - No data to display   Initial Impression / Assessment and Plan / ED Course  I have reviewed the triage vital signs and the  nursing notes.  Pertinent labs & imaging results that were available during my care of the patient were reviewed by me and considered in my medical decision making (see chart for details).     Patient be treated for an influenza-like illness.  Told to return here as needed.  Is advised to rest as much as possible and increase her fluid intake.  Tylenol and Motrin for any fevers.  Final Clinical Impressions(s) / ED Diagnoses   Final diagnoses:  None    ED Discharge Orders    None       Charlestine NightLawyer, Jamyria Ozanich, Cordelia Poche-C 11/13/18 1615    Azalia Bilisampos, Kevin, MD 11/15/18 [phone]

## 2019-01-23 ENCOUNTER — Other Ambulatory Visit: Payer: Self-pay

## 2019-01-23 ENCOUNTER — Emergency Department (HOSPITAL_BASED_OUTPATIENT_CLINIC_OR_DEPARTMENT_OTHER)
Admission: EM | Admit: 2019-01-23 | Discharge: 2019-01-23 | Disposition: A | Discharge status: Home / Self Care | Payer: Self-pay | Attending: Emergency Medicine | Admitting: Emergency Medicine

## 2019-01-23 ENCOUNTER — Encounter (HOSPITAL_BASED_OUTPATIENT_CLINIC_OR_DEPARTMENT_OTHER): Payer: Self-pay | Admitting: *Deleted

## 2019-01-23 DIAGNOSIS — Z87891 Personal history of nicotine dependence: Secondary | ICD-10-CM | POA: Insufficient documentation

## 2019-01-23 DIAGNOSIS — J028 Acute pharyngitis due to other specified organisms: Secondary | ICD-10-CM | POA: Insufficient documentation

## 2019-01-23 DIAGNOSIS — Z79899 Other long term (current) drug therapy: Secondary | ICD-10-CM | POA: Insufficient documentation

## 2019-01-23 DIAGNOSIS — B9789 Other viral agents as the cause of diseases classified elsewhere: Secondary | ICD-10-CM | POA: Insufficient documentation

## 2019-01-23 DIAGNOSIS — J029 Acute pharyngitis, unspecified: Secondary | ICD-10-CM

## 2019-01-23 LAB — URINALYSIS, ROUTINE W REFLEX MICROSCOPIC
BILIRUBIN URINE: NEGATIVE
Glucose, UA: NEGATIVE mg/dL
Ketones, ur: NEGATIVE mg/dL
Leukocytes,Ua: NEGATIVE
Nitrite: NEGATIVE
PH: 5.5 (ref 5.0–8.0)
Protein, ur: NEGATIVE mg/dL
SPECIFIC GRAVITY, URINE: 1.025 (ref 1.005–1.030)

## 2019-01-23 LAB — URINALYSIS, MICROSCOPIC (REFLEX)

## 2019-01-23 LAB — GROUP A STREP BY PCR: Group A Strep by PCR: NOT DETECTED

## 2019-01-23 MED ORDER — PREDNISONE 20 MG PO TABS: 40.0000 mg | ORAL_TABLET | Freq: Every day | ORAL | 0 refills | Status: DC

## 2019-01-23 NOTE — ED Provider Notes (Signed)
[facility] EMERGENCY DEPARTMENT Provider Note   CSN: [id number] Arrival date & time: 01/23/19  1431    History   Chief Complaint Chief Complaint  Patient presents with  . Sore Throat    HPI Cathy Berger is a 28 y.o. female without significant PMHx, presenting to the ED with complaint of persistent sore throat that began on Thursday. Pt reports gradual onset of symptoms, with assoc dry cough and one fever of 102F. She treated her symptoms once with tylenol and with OTC throat spray. No known sick contacts. No difficulty swallowing or breathing. Patient with second complaint of dysuria x1 week.  No associated vaginal bleeding or discharge, abdominal pain, flank pain, nausea, vomiting.     The history is provided by the patient.    History reviewed. No pertinent past medical history.  There are no active problems to display for this patient.   History reviewed. No pertinent surgical history.   OB History   No obstetric history on file.      Home Medications    Prior to Admission medications   Medication Sig Start Date End Date Taking? Authorizing Provider  acetaminophen-codeine 120-12 MG/5ML solution Take 10 mLs by mouth every 4 (four) hours as needed for moderate pain. 11/13/18   Lawyer, Cristal Deer, PA-C  Guaifenesin 1200 MG TB12 Take 1 tablet (1,200 mg total) by mouth 2 (two) times daily. 11/13/18   Lawyer, Cristal Deer, PA-C  ibuprofen (ADVIL,MOTRIN) 800 MG tablet Take 1 tablet (800 mg total) by mouth every 8 (eight) hours as needed. 11/13/18   Lawyer, Cristal Deer, PA-C    Family History No family history on file.  Social History Social History   Tobacco Use  . Smoking status: Former Games developer  . Smokeless tobacco: Never Used  Substance Use Topics  . Alcohol use: Not Currently    Comment: occ  . Drug use: Not Currently    Types: Marijuana     Allergies   Patient has no known allergies.   Review of Systems Review of Systems  Constitutional:  Positive for fever. Negative for chills.  HENT: Positive for sore throat. Negative for trouble swallowing and voice change.   Respiratory: Positive for cough. Negative for shortness of breath.   Gastrointestinal: Negative for abdominal pain, nausea and vomiting.  Genitourinary: Positive for dysuria. Negative for flank pain, frequency, vaginal bleeding and vaginal discharge.  All other systems reviewed and are negative.    Physical Exam Updated Vital Signs BP 135/86 (BP Location: Left Arm)   Pulse 66   Temp 98.8 F (37.1 C) (Oral)   Resp 16   Ht 5\' 4"  (1.626 m)   Wt 104.3 kg   SpO2 100%   BMI 39.48 kg/m   Physical Exam Vitals signs and nursing note reviewed.  Constitutional:      General: She is not in acute distress.    Appearance: She is well-developed. She is not ill-appearing.  HENT:     Head: Normocephalic and atraumatic.     Right Ear: Tympanic membrane and ear canal normal.     Left Ear: Tympanic membrane and ear canal normal.     Mouth/Throat:     Mouth: Mucous membranes are moist.     Pharynx: Uvula midline. Posterior oropharyngeal erythema present. No pharyngeal swelling, oropharyngeal exudate or uvula swelling.     Tonsils: No tonsillar exudate.     Comments: Tolerating secretions Eyes:     Conjunctiva/sclera: Conjunctivae normal.  Neck:     Musculoskeletal: Normal range of  motion and neck supple. No neck rigidity.  Cardiovascular:     Rate and Rhythm: Normal rate and regular rhythm.  Pulmonary:     Effort: Pulmonary effort is normal. No respiratory distress.     Breath sounds: Normal breath sounds.  Abdominal:     General: There is no distension.     Tenderness: There is no abdominal tenderness.  Lymphadenopathy:     Cervical: Cervical adenopathy (Mild anterior cervical adenopathy bilaterally) present.  Neurological:     Mental Status: She is alert.  Psychiatric:        Mood and Affect: Mood normal.        Behavior: Behavior normal.      ED  Treatments / Results  Labs (all labs ordered are listed, but only abnormal results are displayed) Labs Reviewed  URINALYSIS, ROUTINE W REFLEX MICROSCOPIC - Abnormal; Notable for the following components:      Result Value   Hgb urine dipstick SMALL (*)    All other components within normal limits  URINALYSIS, MICROSCOPIC (REFLEX) - Abnormal; Notable for the following components:   Bacteria, UA RARE (*)    All other components within normal limits  GROUP A STREP BY PCR  URINE CULTURE    EKG None  Radiology No results found.  Procedures Procedures (including critical care time)  Medications Ordered in ED Medications - No data to display   Initial Impression / Assessment and Plan / ED Course  I have reviewed the triage vital signs and the nursing notes.  Pertinent labs & imaging results that were available during my care of the patient were reviewed by me and considered in my medical decision making (see chart for details).        Pt  presents with mild cervical lymphadenopathy & dysphagia; diagnosis of viral pharyngitis. Pt afebrile without tonsillar exudate, negative strep. No abx indicated. DC w symptomatic tx for pain  Pt does not appear dehydrated, but did discuss importance of water rehydration. Presentation non concerning for PTA or infxn spread to soft tissue. No trismus or uvula deviation.  UA is negative for infection.  Recommend PCP follow-up if symptoms persist.  Specific return precautions discussed. Pt able to drink water in ED without difficulty with intact air way. Recommended PCP follow up.  Discussed results, findings, treatment and follow up. Patient advised of return precautions. Patient verbalized understanding and agreed with plan.  Final Clinical Impressions(s) / ED Diagnoses   Final diagnoses:  Viral pharyngitis    ED Discharge Orders         Ordered    predniSONE (DELTASONE) 20 MG tablet  Daily,   Status:  Discontinued  -- ORDERED IN ERROR    01/23/19 1708           Robinson, [place] N, PA-C 01/23/19 1736    Rolan Bucco, MD 01/23/19 2320

## 2019-01-23 NOTE — ED Triage Notes (Signed)
Sore throat and fever since thursday 

## 2019-01-23 NOTE — ED Notes (Signed)
Burning sensation during urination

## 2019-01-23 NOTE — Discharge Instructions (Addendum)
Please read instructions below.  You can take tylenol or ibuprofen as needed for sore throat or fever.  Drink plenty of water.  Use saline nasal spray for congestion. Follow up with your primary care provider as needed.  Return to the ER for inability to swallow liquids, difficulty breathing, or new or worsening symptoms.  

## 2019-01-23 NOTE — ED Notes (Signed)
ED Provider at bedside. 

## 2019-01-25 LAB — URINE CULTURE

## 2019-07-14 ENCOUNTER — Emergency Department (HOSPITAL_BASED_OUTPATIENT_CLINIC_OR_DEPARTMENT_OTHER)
Admission: EM | Admit: 2019-07-14 | Discharge: 2019-07-14 | Disposition: A | Discharge status: Home / Self Care | Payer: Self-pay | Attending: Emergency Medicine | Admitting: Emergency Medicine

## 2019-07-14 ENCOUNTER — Other Ambulatory Visit: Payer: Self-pay

## 2019-07-14 ENCOUNTER — Encounter (HOSPITAL_BASED_OUTPATIENT_CLINIC_OR_DEPARTMENT_OTHER): Payer: Self-pay | Admitting: Emergency Medicine

## 2019-07-14 DIAGNOSIS — Z87891 Personal history of nicotine dependence: Secondary | ICD-10-CM | POA: Insufficient documentation

## 2019-07-14 DIAGNOSIS — H9203 Otalgia, bilateral: Secondary | ICD-10-CM | POA: Insufficient documentation

## 2019-07-14 DIAGNOSIS — Z79899 Other long term (current) drug therapy: Secondary | ICD-10-CM | POA: Insufficient documentation

## 2019-07-14 MED ORDER — CETIRIZINE-PSEUDOEPHEDRINE ER 5-120 MG PO TB12: 1.0000 | ORAL_TABLET | Freq: Every day | ORAL | 0 refills | Status: DC

## 2019-07-14 MED ORDER — AMOXICILLIN 500 MG PO CAPS: 500.0000 mg | ORAL_CAPSULE | Freq: Two times a day (BID) | ORAL | 0 refills | Status: DC

## 2019-07-14 MED ORDER — FLUTICASONE PROPIONATE 50 MCG/ACT NA SUSP: 1.0000 | Freq: Every day | NASAL | 0 refills | Status: DC

## 2019-07-14 NOTE — ED Triage Notes (Signed)
Patient complains of bilateral ear pain onset 5 days ago with left side headache and intermittent dizziness; states feels like ears need to "pop". Denies any drainage from ears; denies taking any otc medication for pain. NAD noted; ambulatory with steady gait.

## 2019-07-14 NOTE — ED Provider Notes (Signed)
[facility] EMERGENCY DEPARTMENT Provider Note   CSN: [id number] Arrival date & time: 07/14/19  1927     History   Chief Complaint Chief Complaint  Patient presents with  . Otalgia    HPI Cathy Berger is a 28 y.o. female who is previously healthy who presents with a 5-day history of bilateral ear pain and fullness.  She reports the left is worse than the right.  She denies any fevers, nasal congestion, sore throat, cough, loss of taste or smell.  No medications taken prior to arrival.  Patient has had some associated dizziness and lightheadedness.  No history of ear problems.     HPI  History reviewed. No pertinent past medical history.  There are no active problems to display for this patient.   History reviewed. No pertinent surgical history.   OB History   No obstetric history on file.      Home Medications    Prior to Admission medications   Medication Sig Start Date End Date Taking? Authorizing Provider  acetaminophen-codeine 120-12 MG/5ML solution Take 10 mLs by mouth every 4 (four) hours as needed for moderate pain. 11/13/18   Lawyer, Harrell Gave, PA-C  amoxicillin (AMOXIL) 500 MG capsule Take 1 capsule (500 mg total) by mouth 2 (two) times daily. 07/14/19   Kyo Cocuzza, Bea Graff, PA-C  cetirizine-pseudoephedrine (ZYRTEC-D) 5-120 MG tablet Take 1 tablet by mouth daily. 07/14/19   Pia Jedlicka, Bea Graff, PA-C  fluticasone (FLONASE) 50 MCG/ACT nasal spray Place 1 spray into both nostrils daily. 07/14/19   Wray Goehring, Bea Graff, PA-C  Guaifenesin 1200 MG TB12 Take 1 tablet (1,200 mg total) by mouth 2 (two) times daily. 11/13/18   Lawyer, Harrell Gave, PA-C  ibuprofen (ADVIL,MOTRIN) 800 MG tablet Take 1 tablet (800 mg total) by mouth every 8 (eight) hours as needed. 11/13/18   Dalia Heading, PA-C    Family History History reviewed. No pertinent family history.  Social History Social History   Tobacco Use  . Smoking status: Former Research scientist (life sciences)  . Smokeless tobacco: Never Used   Substance Use Topics  . Alcohol use: Not Currently    Comment: occ  . Drug use: Not Currently    Types: Marijuana     Allergies   Patient has no known allergies.   Review of Systems Review of Systems  Constitutional: Negative for fever.  HENT: Positive for ear pain. Negative for congestion, ear discharge and sore throat.   Respiratory: Negative for cough.      Physical Exam Updated Vital Signs BP (!) 146/98 (BP Location: Right Arm)   Pulse 68   Temp 98.5 F (36.9 C) (Oral)   Resp 16   Ht 5\' 4"  (1.626 m)   Wt 104 kg   LMP 07/08/2019   SpO2 100%   BMI 39.36 kg/m   Physical Exam Vitals signs and nursing note reviewed.  Constitutional:      General: She is not in acute distress.    Appearance: She is well-developed. She is not diaphoretic.  HENT:     Head: Normocephalic and atraumatic.     Right Ear: No mastoid tenderness. Tympanic membrane is injected. Tympanic membrane is not bulging.     Left Ear: No mastoid tenderness. Tympanic membrane is injected. Tympanic membrane is not bulging.     Mouth/Throat:     Pharynx: No oropharyngeal exudate.  Eyes:     General: No scleral icterus.       Right eye: No discharge.  Left eye: No discharge.     Conjunctiva/sclera: Conjunctivae normal.     Pupils: Pupils are equal, round, and reactive to light.  Neck:     Musculoskeletal: Normal range of motion and neck supple.     Thyroid: No thyromegaly.  Cardiovascular:     Rate and Rhythm: Normal rate and regular rhythm.     Heart sounds: Normal heart sounds. No murmur. No friction rub. No gallop.   Pulmonary:     Effort: Pulmonary effort is normal. No respiratory distress.     Breath sounds: Normal breath sounds. No stridor. No wheezing or rales.  Abdominal:     General: Bowel sounds are normal. There is no distension.     Palpations: Abdomen is soft.     Tenderness: There is no abdominal tenderness. There is no guarding or rebound.  Lymphadenopathy:     Cervical:  No cervical adenopathy.  Skin:    General: Skin is warm and dry.     Coloration: Skin is not pale.     Findings: No rash.  Neurological:     Mental Status: She is alert.     Coordination: Coordination normal.      ED Treatments / Results  Labs (all labs ordered are listed, but only abnormal results are displayed) Labs Reviewed - No data to display  EKG None  Radiology No results found.  Procedures Procedures (including critical care time)  Medications Ordered in ED Medications - No data to display   Initial Impression / Assessment and Plan / ED Course  I have reviewed the triage vital signs and the nursing notes.  Pertinent labs & imaging results that were available during my care of the patient were reviewed by me and considered in my medical decision making (see chart for details).        Patient with injection to bilateral ears; no significant effusion or bulging noted.  Patient is afebrile.  No mastoid tenderness concerning for mastoiditis. Will treat with amoxicillin for early otitis media, but also Flonase and Zyrtec-D for congestion versus eustachian tube dysfunction.  Return precautions discussed.  Patient understands and agrees with plan.  Patient vital stable throughout ED course and discharged in satisfactory condition.  Final Clinical Impressions(s) / ED Diagnoses   Final diagnoses:  Otalgia of both ears    ED Discharge Orders         Ordered    cetirizine-pseudoephedrine (ZYRTEC-D) 5-120 MG tablet  Daily     07/14/19 2106    fluticasone (FLONASE) 50 MCG/ACT nasal spray  Daily     07/14/19 2106    amoxicillin (AMOXIL) 500 MG capsule  2 times daily     07/14/19 2106           Emi HolesLaw, Murtaza Shell M, PA-C 07/14/19 2110    Gwyneth SproutPlunkett, Whitney, MD 07/14/19 2334

## 2019-07-14 NOTE — Discharge Instructions (Addendum)
Take amoxicillin until completed for what appears to be early ear infection.  Take Zyrtec-D and Flonase once daily for the congestion in your ears.  Please return the emergency department if you develop any new or worsening symptoms including pain behind your ear, persistent fever of 100.4, drainage, or any other concerning symptoms.

## 2019-11-02 ENCOUNTER — Encounter (HOSPITAL_BASED_OUTPATIENT_CLINIC_OR_DEPARTMENT_OTHER): Payer: Self-pay | Admitting: Emergency Medicine

## 2019-11-02 ENCOUNTER — Other Ambulatory Visit: Payer: Self-pay

## 2019-11-02 ENCOUNTER — Emergency Department (HOSPITAL_BASED_OUTPATIENT_CLINIC_OR_DEPARTMENT_OTHER)
Admission: EM | Admit: 2019-11-02 | Discharge: 2019-11-02 | Disposition: A | Discharge status: Home / Self Care | Payer: Self-pay | Attending: Emergency Medicine | Admitting: Emergency Medicine

## 2019-11-02 DIAGNOSIS — R519 Headache, unspecified: Secondary | ICD-10-CM | POA: Insufficient documentation

## 2019-11-02 DIAGNOSIS — R509 Fever, unspecified: Secondary | ICD-10-CM | POA: Insufficient documentation

## 2019-11-02 DIAGNOSIS — Z79899 Other long term (current) drug therapy: Secondary | ICD-10-CM | POA: Insufficient documentation

## 2019-11-02 DIAGNOSIS — Z20828 Contact with and (suspected) exposure to other viral communicable diseases: Secondary | ICD-10-CM | POA: Insufficient documentation

## 2019-11-02 DIAGNOSIS — Z6839 Body mass index (BMI) 39.0-39.9, adult: Secondary | ICD-10-CM | POA: Insufficient documentation

## 2019-11-02 DIAGNOSIS — Z87891 Personal history of nicotine dependence: Secondary | ICD-10-CM | POA: Insufficient documentation

## 2019-11-02 DIAGNOSIS — E669 Obesity, unspecified: Secondary | ICD-10-CM | POA: Insufficient documentation

## 2019-11-02 NOTE — ED Provider Notes (Signed)
[facility] EMERGENCY DEPARTMENT Provider Note   CSN: [id number] Arrival date & time: 11/02/19  0840     History Chief Complaint  Patient presents with  . Fever  . Fatigue    Cathy Berger is a 28 y.o. female.  HPI      Cathy Berger is a 28 y.o. female, , presenting to the ED with fever of 101F noted this morning at work. Work sent her in. Tylenol resolved the fever. She had vomiting three days ago and this has resolved. Persistent loss of appetite and intermittent, mild headache, none currently.   Denies current cough, shortness of breath, chest pain, abdominal pain, N/V/D, dizziness, syncope, urinary symptoms, or any other complaints.  History reviewed. No pertinent past medical history.  There are no problems to display for this patient.   History reviewed. No pertinent surgical history.   OB History   No obstetric history on file.     History reviewed. No pertinent family history.  Social History   Tobacco Use  . Smoking status: Former Games developer  . Smokeless tobacco: Never Used  Substance Use Topics  . Alcohol use: Not Currently    Comment: occ  . Drug use: Not Currently    Types: Marijuana    Home Medications Prior to Admission medications   Medication Sig Start Date End Date Taking? Authorizing Provider  acetaminophen-codeine 120-12 MG/5ML solution Take 10 mLs by mouth every 4 (four) hours as needed for moderate pain. 11/13/18   Lawyer, Cristal Deer, PA-C  cetirizine-pseudoephedrine (ZYRTEC-D) 5-120 MG tablet Take 1 tablet by mouth daily. 07/14/19   Law, Waylan Boga, PA-C  fluticasone (FLONASE) 50 MCG/ACT nasal spray Place 1 spray into both nostrils daily. 07/14/19   Law, Waylan Boga, PA-C  Guaifenesin 1200 MG TB12 Take 1 tablet (1,200 mg total) by mouth 2 (two) times daily. 11/13/18   Lawyer, Cristal Deer, PA-C  ibuprofen (ADVIL,MOTRIN) 800 MG tablet Take 1 tablet (800 mg total) by mouth every 8 (eight) hours as needed. 11/13/18   Lawyer,  Cristal Deer, PA-C    Allergies    Patient has no known allergies.  Review of Systems   Review of Systems  Constitutional: Positive for fever.  Respiratory: Negative for cough and shortness of breath.   Cardiovascular: Negative for chest pain and leg swelling.  Gastrointestinal: Negative for abdominal pain, diarrhea, nausea and vomiting.  Genitourinary: Negative for dysuria, flank pain and hematuria.  Musculoskeletal: Negative for back pain.  Neurological: Negative for dizziness, syncope and weakness.  All other systems reviewed and are negative.   Physical Exam Updated Vital Signs BP (!) 143/119   Pulse 65   Temp 98.5 F (36.9 C) (Oral)   Resp 16   Ht 5\' 4"  (1.626 m)   Wt 104.3 kg   SpO2 100%   BMI 39.48 kg/m   Physical Exam Vitals and nursing note reviewed.  Constitutional:      General: She is not in acute distress.    Appearance: She is well-developed. She is obese. She is not diaphoretic.  HENT:     Head: Normocephalic and atraumatic.     Mouth/Throat:     Mouth: Mucous membranes are moist.     Pharynx: Oropharynx is clear.  Eyes:     Conjunctiva/sclera: Conjunctivae normal.  Cardiovascular:     Rate and Rhythm: Normal rate and regular rhythm.     Pulses: Normal pulses.          Radial pulses are 2+ on the right side and 2+  on the left side.       Posterior tibial pulses are 2+ on the right side and 2+ on the left side.     Heart sounds: Normal heart sounds.     Comments: Tactile temperature in the extremities appropriate and equal bilaterally. Pulmonary:     Effort: Pulmonary effort is normal. No respiratory distress.     Breath sounds: Normal breath sounds.  Abdominal:     Palpations: Abdomen is soft.     Tenderness: There is no abdominal tenderness. There is no guarding.  Musculoskeletal:     Cervical back: Neck supple.     Right lower leg: No edema.     Left lower leg: No edema.  Lymphadenopathy:     Cervical: No cervical adenopathy.  Skin:     General: Skin is warm and dry.  Neurological:     Mental Status: She is alert.  Psychiatric:        Mood and Affect: Mood and affect normal.        Speech: Speech normal.        Behavior: Behavior normal.     ED Results / Procedures / Treatments   Labs (all labs ordered are listed, but only abnormal results are displayed) Labs Reviewed  NOVEL CORONAVIRUS, NAA (HOSP ORDER, SEND-OUT TO REF LAB; TAT 18-24 HRS)    EKG None  Radiology No results found.  Procedures Procedures (including critical care time)  Medications Ordered in ED Medications - No data to display  ED Course  I have reviewed the triage vital signs and the nursing notes.  Pertinent labs & imaging results that were available during my care of the patient were reviewed by me and considered in my medical decision making (see chart for details).    MDM Rules/Calculators/A&P                      Patient presents from work complaining of fever. Patient is nontoxic appearing, not tachycardic, not tachypneic, not hypotensive, maintains excellent SPO2 on room air, and is in no apparent distress.  Send out Covid test pending.  Quarantine recommendations discussed. The patient was given instructions for home care as well as return precautions. Patient voices understanding of these instructions, accepts the plan, and is comfortable with discharge.  Blood pressure was noted to be higher than normal, however, I do suspect this may have been cuff positioning.  Regardless, she was informed of this reading and has been advised to follow-up with a PCP.   Cathy Berger was evaluated in Emergency Department on 11/02/2019 for the symptoms described in the history of present illness. She was evaluated in the context of the global COVID-19 pandemic, which necessitated consideration that the patient might be at risk for infection with the SARS-CoV-2 virus that causes COVID-19. Institutional protocols and algorithms that pertain to  the evaluation of patients at risk for COVID-19 are in a state of rapid change based on information released by regulatory bodies including the CDC and federal and state organizations. These policies and algorithms were followed during the patient's care in the ED.  Final Clinical Impression(s) / ED Diagnoses Final diagnoses:  Fever, unspecified fever cause    Rx / DC Orders ED Discharge Orders    None       Layla Maw 11/02/19 1001    Gareth Morgan, MD 11/02/19 2105

## 2019-11-02 NOTE — ED Triage Notes (Signed)
Pt having fatigue,fever, some N/V since Saturday.  N/V has improved.  Occasional cough.  Took tylenol this am 7:30

## 2019-11-02 NOTE — Discharge Instructions (Signed)
Test Results for COVID-19 pending  You have a test pending for COVID-19.  Results typically return within about 48 hours.  Be sure to check MyChart for updated results.  We recommend isolating yourself until results are received.  Patients who have symptoms consistent with COVID-19 should self isolated for: At least 3 days (72 hours) have passed since recovery, defined as resolution of fever without the use of fever reducing medications and improvement in respiratory symptoms (e.g., cough, shortness of breath), and At least 7 days have passed since symptoms first appeared.  If you have no symptoms, but your test returns positive, recommend isolating for at least 10 days.   Your symptoms are likely consistent with a viral illness. Viruses do not require or respond to antibiotics. Treatment is symptomatic care and it is important to note that these symptoms may last for 7-14 days.   Hand washing: Wash your hands throughout the day, but especially before and after touching the face, using the restroom, sneezing, coughing, or touching surfaces that have been coughed or sneezed upon. Hydration: Symptoms of most illnesses will be intensified and complicated by dehydration. Dehydration can also extend the duration of symptoms. Drink plenty of fluids and get plenty of rest. You should be drinking at least half a liter of water an hour to stay hydrated. Electrolyte drinks (ex. Gatorade, Powerade, Pedialyte) are also encouraged. You should be drinking enough fluids to make your urine light yellow, almost clear. If this is not the case, you are not drinking enough water. Please note that some of the treatments indicated below will not be effective if you are not adequately hydrated. Diet: Please concentrate on hydration, however, you may introduce food slowly.  Start with a clear liquid diet, progressed to a full liquid diet, and then bland solids as you are able. Pain or fever: Ibuprofen, Naproxen, or  acetaminophen (generic for Tylenol) for pain or fever.  Antiinflammatory medications: Take 600 mg of ibuprofen every 6 hours or 440 mg (over the counter dose) to 500 mg (prescription dose) of naproxen every 12 hours for the next 3 days. After this time, these medications may be used as needed for pain. Take these medications with food to avoid upset stomach. Choose only one of these medications, do not take them together. Acetaminophen (generic for Tylenol): Should you continue to have additional pain while taking the ibuprofen or naproxen, you may add in acetaminophen as needed. Your daily total maximum amount of acetaminophen from all sources should be limited to 4000mg /day for persons without liver problems, or 2000mg /day for those with liver problems. Zyrtec or Claritin: May add these medication daily to control underlying symptoms of congestion, sneezing, and other signs of allergies.  These medications are available over-the-counter. Generics: Cetirizine (generic for Zyrtec) and loratadine (generic for Claritin). Fluticasone: Use fluticasone (generic for Flonase), as directed, for nasal and sinus congestion.  This medication is available over-the-counter. Congestion: Plain guaifenesin (generic for plain Mucinex) may help relieve congestion. Saline sinus rinses and saline nasal sprays may also help relieve congestion. If you do not have high blood pressure, heart problems, or an allergy to such medications, you may also try phenylephrine or Sudafed. Sore throat: Warm liquids or Chloraseptic spray may help soothe a sore throat. Gargle twice a day with a salt water solution made from a half teaspoon of salt in a cup of warm water.  Follow up: Follow up with a primary care provider within the next two weeks should symptoms fail to resolve.  Return: Return to the ED for significantly worsening symptoms, shortness of breath, persistent vomiting, large amounts of blood in stool, or any other major  concerns.  For prescription assistance, may try using prescription discount sites or apps, such as goodrx.com

## 2019-11-04 LAB — NOVEL CORONAVIRUS, NAA (HOSP ORDER, SEND-OUT TO REF LAB; TAT 18-24 HRS): SARS-CoV-2, NAA: NOT DETECTED

## 2019-12-15 ENCOUNTER — Encounter (HOSPITAL_BASED_OUTPATIENT_CLINIC_OR_DEPARTMENT_OTHER): Payer: Self-pay

## 2019-12-15 ENCOUNTER — Other Ambulatory Visit: Payer: Self-pay

## 2019-12-15 ENCOUNTER — Emergency Department (HOSPITAL_BASED_OUTPATIENT_CLINIC_OR_DEPARTMENT_OTHER)
Admission: EM | Admit: 2019-12-15 | Discharge: 2019-12-15 | Disposition: A | Discharge status: Home / Self Care | Payer: Self-pay | Attending: Emergency Medicine | Admitting: Emergency Medicine

## 2019-12-15 DIAGNOSIS — J029 Acute pharyngitis, unspecified: Secondary | ICD-10-CM | POA: Insufficient documentation

## 2019-12-15 DIAGNOSIS — Z79899 Other long term (current) drug therapy: Secondary | ICD-10-CM | POA: Insufficient documentation

## 2019-12-15 DIAGNOSIS — R519 Headache, unspecified: Secondary | ICD-10-CM | POA: Insufficient documentation

## 2019-12-15 DIAGNOSIS — R05 Cough: Secondary | ICD-10-CM | POA: Insufficient documentation

## 2019-12-15 LAB — GROUP A STREP BY PCR: Group A Strep by PCR: NOT DETECTED

## 2019-12-15 MED ORDER — LIDOCAINE VISCOUS HCL 2 % MT SOLN: 15.0000 mL | OROMUCOSAL | 0 refills | Status: DC | PRN

## 2019-12-15 NOTE — ED Triage Notes (Addendum)
Pt with flu like sx x 3 days with +covid exposure-states she had neg covid test last week-NAD-steady gait

## 2019-12-15 NOTE — ED Provider Notes (Signed)
[facility] EMERGENCY DEPARTMENT Provider Note   CSN: [id number] Arrival date & time: 12/15/19  1512     History Chief Complaint  Patient presents with  . Cough    Cathy Berger is a 29 y.o. female.  The history is provided by the patient and medical records. No language interpreter was used.      Cathy Berger is an otherwise healthy 29 y.o. female who presents to the Emergency Department complaining of sore throat x 3 days.  Associated with headache and mild dry cough. No chest pain or shortness of breath. No fevers or body aches. No abdominal pain, n/v/d. Tried Tylenol with no improvement. Her mother had Covid the very end of December until mid January. They live together. She was told by health department to quarantine through [employer] as well as an additional 10 days then get a covid test which she did. This test was 1 week ago and negative. No sick contacts other than mother.   History reviewed. No pertinent past medical history.  There are no problems to display for this patient.   History reviewed. No pertinent surgical history.   OB History   No obstetric history on file.     No family history on file.  Social History   Tobacco Use  . Smoking status: Never Smoker  . Smokeless tobacco: Never Used  Substance Use Topics  . Alcohol use: Yes    Comment: occ  . Drug use: Yes    Types: Marijuana    Home Medications Prior to Admission medications   Medication Sig Start Date End Date Taking? Authorizing Provider  fluticasone (FLONASE) 50 MCG/ACT nasal spray Place 1 spray into both nostrils daily. 07/14/19   Law, Bea Graff, PA-C  lidocaine (XYLOCAINE) 2 % solution Use as directed 15 mLs in the mouth or throat every 4 (four) hours as needed for mouth pain. 12/15/19   Kimisha Eunice, Ozella Almond, PA-C    Allergies    Patient has no known allergies.  Review of Systems   Review of Systems  Constitutional: Negative for chills and fever.  HENT:  Positive for sore throat. Negative for congestion.   Respiratory: Positive for cough. Negative for shortness of breath.   Cardiovascular: Negative for chest pain.  Gastrointestinal: Negative for abdominal pain, nausea and vomiting.  Neurological: Positive for headaches. Negative for weakness and numbness.    Physical Exam Updated Vital Signs BP (!) 137/100 (BP Location: Left Arm)   Pulse 69   Temp 98.6 F (37 C) (Oral)   Resp 20   Ht 5\' 4"  (1.626 m)   Wt 99.3 kg   LMP 12/08/2019   SpO2 100%   BMI 37.59 kg/m   Physical Exam Vitals and nursing note reviewed.  Constitutional:      General: She is not in acute distress.    Appearance: She is well-developed.  HENT:     Head: Normocephalic and atraumatic.     Mouth/Throat:     Comments: OP with erythema, but no tonsillar hypertrophy or exudates.  Cardiovascular:     Rate and Rhythm: Normal rate and regular rhythm.     Heart sounds: Normal heart sounds. No murmur.  Pulmonary:     Effort: Pulmonary effort is normal. No respiratory distress.     Breath sounds: Normal breath sounds.     Comments: Lungs clear to ausculation bilaterally.  Abdominal:     General: There is no distension.     Palpations: Abdomen is soft.  Tenderness: There is no abdominal tenderness.  Musculoskeletal:     Cervical back: Neck supple.  Skin:    General: Skin is warm and dry.  Neurological:     Mental Status: She is alert and oriented to person, place, and time.     ED Results / Procedures / Treatments   Labs (all labs ordered are listed, but only abnormal results are displayed) Labs Reviewed  GROUP A STREP BY PCR    EKG None  Radiology No results found.  Procedures Procedures (including critical care time)  Medications Ordered in ED Medications - No data to display  ED Course  I have reviewed the triage vital signs and the nursing notes.  Pertinent labs & imaging results that were available during my care of the patient were  reviewed by me and considered in my medical decision making (see chart for details).    MDM Rules/Calculators/A&P                      Cathy Berger is a 29 y.o. female who presents to ED for sore throat x 3 days. Associated with mild dry cough and headaches. On exam, patient is afebrile, hemodynamically stable with clear lung exam. OP with mild erythema but no tonsillar hypertrophy or exudates. Strep negative. Her mother did have covid and she lives with her.  Her mother's illness was the end of December through early January.  She then did another 10-day quarantine since she lived with her and had a Covid test at the end of this time.  Which was negative last week.  Given her new symptoms, still encouraged a coronavirus test today even with this negative test last week, however patient declined. Evaluation does not show pathology that would require ongoing emergent intervention or inpatient treatment. PCP follow up encouraged if symptoms persistent. Return precautions discussed and all questions answered.    Final Clinical Impression(s) / ED Diagnoses Final diagnoses:  Sore throat    Rx / DC Orders ED Discharge Orders         Ordered    lidocaine (XYLOCAINE) 2 % solution  Every 4 hours PRN     12/15/19 1639           Akeia Perot, Chase Picket, PA-C 12/15/19 1709    Milagros Loll, MD 12/17/19 [phone]

## 2019-12-15 NOTE — Discharge Instructions (Signed)
Alternate between Tylenol and ibuprofen as needed for pain. Gargle warm salt water and spit it out. It is very important to stay hydrated!  Follow up with your primary care doctor in 5-7 days for recheck of ongoing symptoms and return to emergency department if any new or worsening of symptoms develop or you have any additional concerns.  °

## 2020-05-17 ENCOUNTER — Encounter (HOSPITAL_BASED_OUTPATIENT_CLINIC_OR_DEPARTMENT_OTHER): Payer: Self-pay | Admitting: Emergency Medicine

## 2020-05-17 ENCOUNTER — Other Ambulatory Visit: Payer: Self-pay

## 2020-05-17 ENCOUNTER — Emergency Department (HOSPITAL_BASED_OUTPATIENT_CLINIC_OR_DEPARTMENT_OTHER): Payer: Medicaid Other

## 2020-05-17 ENCOUNTER — Emergency Department (HOSPITAL_BASED_OUTPATIENT_CLINIC_OR_DEPARTMENT_OTHER)
Admission: EM | Admit: 2020-05-17 | Discharge: 2020-05-17 | Disposition: A | Discharge status: Home / Self Care | Payer: Medicaid Other | Attending: Emergency Medicine | Admitting: Emergency Medicine

## 2020-05-17 DIAGNOSIS — W010XXA Fall on same level from slipping, tripping and stumbling without subsequent striking against object, initial encounter: Secondary | ICD-10-CM | POA: Insufficient documentation

## 2020-05-17 DIAGNOSIS — M545 Low back pain, unspecified: Secondary | ICD-10-CM

## 2020-05-17 DIAGNOSIS — Y9301 Activity, walking, marching and hiking: Secondary | ICD-10-CM | POA: Insufficient documentation

## 2020-05-17 DIAGNOSIS — W19XXXA Unspecified fall, initial encounter: Secondary | ICD-10-CM

## 2020-05-17 DIAGNOSIS — Y9234 Swimming pool (public) as the place of occurrence of the external cause: Secondary | ICD-10-CM | POA: Insufficient documentation

## 2020-05-17 DIAGNOSIS — Y999 Unspecified external cause status: Secondary | ICD-10-CM | POA: Insufficient documentation

## 2020-05-17 LAB — PREGNANCY, URINE: Preg Test, Ur: NEGATIVE

## 2020-05-17 MED ORDER — NAPROXEN 250 MG PO TABS
ORAL_TABLET | ORAL | Status: AC
  Filled 2020-05-17: qty 1

## 2020-05-17 MED ORDER — NAPROXEN 250 MG PO TABS
500.0000 mg | ORAL_TABLET | Freq: Once | ORAL | Status: AC
  Administered 2020-05-17: 500 mg via ORAL

## 2020-05-17 MED ORDER — METHOCARBAMOL 500 MG PO TABS: 500.0000 mg | ORAL_TABLET | Freq: Two times a day (BID) | ORAL | 0 refills | Status: AC

## 2020-05-17 MED ORDER — LIDOCAINE 5 % EX PTCH: 1.0000 | MEDICATED_PATCH | CUTANEOUS | 0 refills | Status: AC

## 2020-05-17 MED ORDER — NAPROXEN 500 MG PO TABS: 500.0000 mg | ORAL_TABLET | Freq: Two times a day (BID) | ORAL | 0 refills | Status: AC

## 2020-05-17 NOTE — ED Notes (Signed)
Patient transported to CT 

## 2020-05-17 NOTE — Discharge Instructions (Addendum)
Your CT scan did not show any signs of fracture. 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 Please follow up with your PCP regarding your ED visit today as well as the findings on your CT scan. If you do not have a PCP you can follow up with [facility] for primary care needs.   I have prescribed medication for you to take over the next week for symptomatic relief. DO NOT DRIVE WHILE ON THE MUSCLE RELAXER AS IT CAN MAKE YOU DROWSY. I would recommend taking the antiinflammatory during the day and then to take the muscle relaxer at nighttime to help you sleep.   Return to the ED for any worsening symptoms including worsening pain, inability to urinate, peeing or pooping on yourself, blood in your urine, numbness in your groin, weakness/numbness/tingling in your legs, or any other new/concerning symptoms.

## 2020-05-17 NOTE — ED Triage Notes (Signed)
Pt fell on the cement at the pool 3 days ago.  Bilateral low back pain.  No radiation of pain. Skin intact.

## 2020-05-17 NOTE — ED Provider Notes (Signed)
[facility] EMERGENCY DEPARTMENT Provider Note   CSN: [id number] Arrival date & time: 05/17/20  1315     History Chief Complaint  Patient presents with  . Back Pain    Cathy Berger is a 29 y.o. female who presents to the ED today with complaint of sudden onset, constant, sharp, worsening, mid lower back pain x 3 days.  Patient reports she was at the swimming pool on Sunday and slipped on wet water and landed directly onto her back on concrete.  She states she was unable to get up immediately due to significant pain.  She states that since then she has been taking over-the-counter medications including ibuprofen and Tylenol without relief.  She states that she has had to walk slumped over due to significant pain.  She denies any head injury or loss of consciousness during the fall.  No other complaints of pain at this time.  Patient denies fevers, chills, hematuria, weakness or numbness/tingling in bilateral lower extremities, urinary retention, urinary or bowel incontinence, saddle anesthesia, any other associated symptoms.   The history is provided by the patient and medical records.       History reviewed. No pertinent past medical history.  There are no problems to display for this patient.   History reviewed. No pertinent surgical history.   OB History   No obstetric history on file.     No family history on file.  Social History   Tobacco Use  . Smoking status: Never Smoker  . Smokeless tobacco: Never Used  Vaping Use  . Vaping Use: Never used  Substance Use Topics  . Alcohol use: Yes    Comment: occ  . Drug use: Yes    Types: Marijuana    Home Medications Prior to Admission medications   Medication Sig Start Date End Date Taking? Authorizing Provider  lidocaine (LIDODERM) 5 % Place 1 patch onto the skin daily. Remove & Discard patch within 12 hours or as directed by MD 05/17/20   Tanda Rockers, PA-C  methocarbamol (ROBAXIN) 500 MG tablet Take 1  tablet (500 mg total) by mouth 2 (two) times daily. 05/17/20   Hyman Hopes, Emila Steinhauser, PA-C  naproxen (NAPROSYN) 500 MG tablet Take 1 tablet (500 mg total) by mouth 2 (two) times daily. 05/17/20   Tanda Rockers, PA-C    Allergies    Patient has no known allergies.  Review of Systems   Review of Systems  Constitutional: Negative for chills and fever.  Musculoskeletal: Positive for back pain and gait problem.  Neurological: Negative for syncope, weakness, numbness and headaches.  All other systems reviewed and are negative.   Physical Exam Updated Vital Signs BP (!) 162/101 (BP Location: Right Arm)   Pulse 76   Temp 98.7 F (37.1 C) (Oral)   Resp 16   Ht 5\' 4"  (1.626 m)   Wt 95.1 kg   LMP 05/16/2020   SpO2 100%   BMI 35.98 kg/m   Physical Exam Vitals and nursing note reviewed.  Constitutional:      Appearance: She is not ill-appearing.  HENT:     Head: Normocephalic and atraumatic.  Eyes:     Conjunctiva/sclera: Conjunctivae normal.  Cardiovascular:     Rate and Rhythm: Normal rate and regular rhythm.     Pulses: Normal pulses.  Pulmonary:     Effort: Pulmonary effort is normal.     Breath sounds: Normal breath sounds. No wheezing, rhonchi or rales.  Musculoskeletal:     Comments: No overlying  skin changes including ecchymosis or abrasions noted to back. No C or T midline spinal TTP. + midline lumbar spinal TTP with R > L paralumbar musculature TTP. ROM intact to neck and back. Strength 5/5 to BUE. Strength 4/5 to BLEs. Sensation intact throughout. 2+ radial and DP pulses bilaterally.   Skin:    General: Skin is warm and dry.     Coloration: Skin is not jaundiced.  Neurological:     Mental Status: She is alert.     ED Results / Procedures / Treatments   Labs (all labs ordered are listed, but only abnormal results are displayed) Labs Reviewed  PREGNANCY, URINE    EKG None  Radiology DG Lumbar Spine Complete  Result Date: 05/17/2020 CLINICAL DATA:  Larey Seat on cement  with low back pain EXAM: LUMBAR SPINE - COMPLETE 4+ VIEW COMPARISON:  12/10/2017 FINDINGS: Lumbar alignment is normal. Mild disc space narrowing at L5-S1. Vertebral body heights are maintained. Possible transverse process lucencies on the left side at L3 and L5. IMPRESSION: 1. Possible fracture lucencies at left transverse process of L3 and L5, correlate for focal pain/tenderness. 2. Mild degenerative changes at L5-S1. Electronically Signed   By: Jasmine Pang M.D.   On: 05/17/2020 15:26   CT Lumbar Spine Wo Contrast  Result Date: 05/17/2020 CLINICAL DATA:  29 year old female with fall on cement 3 days ago. Possible left L3 through L5 transverse process fractures on radiographs. EXAM: CT LUMBAR SPINE WITHOUT CONTRAST TECHNIQUE: Multidetector CT imaging of the lumbar spine was performed without intravenous contrast administration. Multiplanar CT image reconstructions were also generated. COMPARISON:  Lumbar radiographs earlier today. FINDINGS: Segmentation: Normal. Alignment: Normal lumbar lordosis. Vertebrae: Bone mineralization is within normal limits. The bilateral lumbar transverse processes are intact. Visible lower posterior ribs appear intact. No lumbar vertebral fracture or No acute osseous abnormality identified. Intact visible sacrum and SI joints. Paraspinal and other soft tissues: Negative visible noncontrast abdominal viscera. Lumbar paraspinal soft tissues are within normal limits. Disc levels: T11-T12: Negative. T12-L1:  Negative. L1-L2:  Negative. L2-L3:  Negative. L3-L4:  Minimal if any disc bulging.  Otherwise negative. L4-L5:  Mild circumferential disc bulge.  No convincing stenosis. L5-S1: Partially calcified central and rightward disc protrusion (series 4, image 90) with endplate spurring. No significant spinal stenosis, but there is at least mild right lateral recess stenosis (right S1 nerve level), and moderate right L5 foraminal stenosis. IMPRESSION: 1. No transverse process fracture or  acute osseous abnormality in the lumbar spine. 2. 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 Query right L5 and/or S1 radiculitis. Electronically Signed   By: Odessa Streat M.D.   On: 05/17/2020 16:29    Procedures Procedures (including critical care time)  Medications Ordered in ED Medications  naproxen (NAPROSYN) tablet 500 mg (500 mg Oral Given 05/17/20 1429)    ED Course  I have reviewed the triage vital signs and the nursing notes.  Pertinent labs & imaging results that were available during my care of the patient were reviewed by me and considered in my medical decision making (see chart for details).    MDM Rules/Calculators/A&P                          29 year old female who presents to the ED today with complaint of sudden onset lower middle back pain after ground-level fall onto concrete 3 days ago.  On arrival to the ED patient is afebrile, nontachycardic and nontachypneic.  An x-ray was  obtained prior to patient being seen, questionable possible fractures noted to the transverse processes of L3 and L5.  On exam patient does not fact have midline tenderness to the L-spine with positive para lumbar musculature tenderness palpation however worse to the right side compared to left.  Her strength is limited secondary to pain to bilateral lower extremities, 4 out of 5.  Strength 5 out of 5 to bilateral upper extremities.  Sensation intact throughout.  She has no red flag symptoms today concerning for cauda equina, spinal epidural abscess, AAA.  Given findings of x-ray will obtain CT scan to further assess for transverse process fx. Pain medication provided while pt was in the waiting room with relief; she did drive herself here and will therefore hold off on additional medication.   CT scan without acute fractures at this time. Does show partially calcified R herniated disc L5-S1; suspect old. Will have pt follow up with her PCP regarding ED visit. Info for [facility]  given. Will discharge with robaxin and naproxen for symptomatic relief; pain likely related to contusion from fall. Strict return precautions discussed. Pt is in agreement with plan and stable for discharge home.   This note was prepared using Dragon voice recognition software and may include unintentional dictation errors due to the inherent limitations of voice recognition software.  Final Clinical Impression(s) / ED Diagnoses Final diagnoses:  Acute midline low back pain without sciatica  Fall, initial encounter    Rx / DC Orders ED Discharge Orders         Ordered    naproxen (NAPROSYN) 500 MG tablet  2 times daily     Discontinue  Reprint     05/17/20 1716    methocarbamol (ROBAXIN) 500 MG tablet  2 times daily     Discontinue  Reprint     05/17/20 1716    lidocaine (LIDODERM) 5 %  Every 24 hours     Discontinue  Reprint     05/17/20 1719           Discharge Instructions     Your CT scan did not show any signs of fracture. 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 Please follow up with your PCP regarding your ED visit today as well as the findings on your CT scan. If you do not have a PCP you can follow up with [facility] for primary care needs.   I have prescribed medication for you to take over the next week for symptomatic relief. DO NOT DRIVE WHILE ON THE MUSCLE RELAXER AS IT CAN MAKE YOU DROWSY. I would recommend taking the antiinflammatory during the day and then to take the muscle relaxer at nighttime to help you sleep.   Return to the ED for any worsening symptoms including worsening pain, inability to urinate, peeing or pooping on yourself, blood in your urine, numbness in your groin, weakness/numbness/tingling in your legs, or any other new/concerning symptoms.        Tanda Rockers, PA-C 05/17/20 1721    Tegeler, Canary Brim, MD 05/17/20 [phone]

## 2022-03-21 ENCOUNTER — Ambulatory Visit (INDEPENDENT_AMBULATORY_CARE_PROVIDER_SITE_OTHER): Payer: 59 | Admitting: Radiology

## 2022-03-21 ENCOUNTER — Other Ambulatory Visit (HOSPITAL_COMMUNITY)
Admission: RE | Admit: 2022-03-21 | Discharge: 2022-03-21 | Disposition: A | Discharge status: Home / Self Care | Payer: Medicaid Other | Source: Ambulatory Visit | Attending: Radiology | Admitting: Radiology

## 2022-03-21 ENCOUNTER — Encounter: Payer: Self-pay | Admitting: Radiology

## 2022-03-21 VITALS — BP 136/88 | Ht 63.5 in | Wt 218.0 lb

## 2022-03-21 DIAGNOSIS — Z01419 Encounter for gynecological examination (general) (routine) without abnormal findings: Secondary | ICD-10-CM | POA: Diagnosis present

## 2022-03-21 DIAGNOSIS — Z30016 Encounter for initial prescription of transdermal patch hormonal contraceptive device: Secondary | ICD-10-CM

## 2022-03-21 DIAGNOSIS — Z30017 Encounter for initial prescription of implantable subdermal contraceptive: Secondary | ICD-10-CM

## 2022-03-21 DIAGNOSIS — N926 Irregular menstruation, unspecified: Secondary | ICD-10-CM

## 2022-03-21 LAB — PREGNANCY, URINE: Preg Test, Ur: NEGATIVE

## 2022-03-21 MED ORDER — XULANE 150-35 MCG/24HR TD PTWK: 1.0000 | MEDICATED_PATCH | TRANSDERMAL | 4 refills | Status: AC

## 2022-03-21 NOTE — Progress Notes (Signed)
? ?  Cathy Berger Mar 14, 1991 [id number] ? ? ?History:  31 y.o. G0 presents for annual exam as a new patient. She c/o vaginal discharge with odor x 1 month. Irregular periods since menarche, more irregular spotting in the past month. Sexually active with 1 female partner on and off x 13 years, no condom use. Knows he has other partners. ? ?Gynecologic History ?No LMP recorded. ?Period Pattern: (!) Irregular ?Contraception/Family planning: none ?Sexually active: yes ?Last Pap: 2020. Results were: normal ? ? ?Obstetric History ?OB History  ?Gravida Para Term Preterm AB Living  ?0 0 0 0 0 0  ?SAB IAB Ectopic Multiple Live Births  ?0 0 0 0 0  ? ? ? ?The following portions of the patient's history were reviewed and updated as appropriate: allergies, current medications, past family history, past medical history, past social history, past surgical history, and problem list. ? ?Review of Systems ?Pertinent items noted in HPI and remainder of comprehensive ROS otherwise negative.  ? ?Past medical history, past surgical history, family history and social history were all reviewed and documented in the EPIC chart. ? ? ?Exam: ? ?Vitals:  ? 03/21/22 1008  ?BP: 136/88  ?Weight: 218 lb (98.9 kg)  ?Height: 5' 3.5" (1.613 m)  ? ?Body mass index is 38.01 kg/m?. ? ?General appearance:  Normal ?Thyroid:  Symmetrical, normal in size, without palpable masses or nodularity. ?Respiratory ? Auscultation:  Clear without wheezing or rhonchi ?Cardiovascular ? Auscultation:  Regular rate, without rubs, murmurs or gallops ? Edema/varicosities:  Not grossly evident ?Abdominal ? Soft,nontender, without masses, guarding or rebound. ? Liver/spleen:  No organomegaly noted ? Hernia:  None appreciated ? Skin ? Inspection:  Grossly normal ?Breasts: Examined lying and sitting.  ? Right: Without masses, retractions, nipple discharge or axillary adenopathy. ? ? Left: Without masses, retractions, nipple discharge or axillary adenopathy. ?Genitourinary   ? Inguinal/mons:  Normal without inguinal adenopathy ? External genitalia:  Normal appearing vulva with no masses, tenderness, or lesions ? BUS/Urethra/Skene's glands:  Normal without masses or exudate ? Vagina:  Normal appearing with normal color and discharge, no lesions ? Cervix:  Normal appearing without discharge or lesions ? Uterus:  Normal in size, shape and contour.  Mobile, nontender ? Adnexa/parametria:   ?  Rt: Normal in size, without masses or tenderness. ?  Lt: Normal in size, without masses or tenderness. ? Anus and perineum: Normal ?  ?Patient informed chaperone available to be present for breast and pelvic exam. Patient has requested no chaperone to be present. Patient has been advised what will be completed during breast and pelvic exam.  ? ?Assessment/Plan:   ?1. Well woman exam with routine gynecological exam ?Pap with cotesting and STI screen ?- Cytology - PAP( [place]) ? ?2. Irregular periods ?Will begin patch for regulation ?- Pregnancy, urine ? ?3. Encounter for insertion of subdermal contraceptive ? ?- norelgestromin-ethinyl estradiol [employer]) 150-35 MCG/24HR transdermal patch; Place 1 patch onto the skin once a week.  Dispense: 9 patch; Refill: 4 ?  ? ? ?Discussed SBE, pap and STI  screening as directed/appropriate. Recommend of exercise weekly, including weight bearing exercise. Encouraged the use of seatbelts and sunscreen. ?Return in 1 year for annual or as needed.  ? ?Tanda Rockers WHNP-BC 10:31 AM 03/21/2022  ?

## 2022-03-25 ENCOUNTER — Telehealth: Payer: Self-pay

## 2022-03-25 LAB — CYTOLOGY - PAP
Adequacy: ABSENT
Chlamydia: NEGATIVE
Comment: NEGATIVE
Comment: NEGATIVE
Comment: NEGATIVE
Comment: NORMAL
Diagnosis: NEGATIVE
High risk HPV: NEGATIVE
Neisseria Gonorrhea: NEGATIVE
Trichomonas: NEGATIVE

## 2022-03-25 NOTE — Telephone Encounter (Signed)
Patient called about test results. ? ?Suszanne Finch, NP is off on Monday. As soon as she reviews them on Tuesday we will be in touch with results. ?

## 2022-03-26 ENCOUNTER — Other Ambulatory Visit: Payer: Self-pay

## 2022-03-26 DIAGNOSIS — N760 Acute vaginitis: Secondary | ICD-10-CM

## 2022-03-26 MED ORDER — METRONIDAZOLE 500 MG PO TABS: 500.0000 mg | ORAL_TABLET | Freq: Two times a day (BID) | ORAL | 0 refills | Status: AC

## 2022-03-26 NOTE — Telephone Encounter (Signed)
Flagyl 500mg  po BID x 7 days

## 2022-03-26 NOTE — Telephone Encounter (Signed)
Already spoke with pt over the phone. Pls review pap result notes.  ?

## 2022-05-01 ENCOUNTER — Ambulatory Visit: Payer: 59 | Admitting: Radiology

## 2022-05-01 DIAGNOSIS — Z0289 Encounter for other administrative examinations: Secondary | ICD-10-CM

## 2022-06-01 IMAGING — CT CT L SPINE W/O CM
3 series · 13 of 33 positions shown, 16 images · non-contrast
Comparison: Lumbar radiographs earlier today.

CLINICAL DATA: 29-year-old female with fall on cement 3 days ago.
Possible left L3 through L5 transverse process fractures on
radiographs.

EXAM:
CT LUMBAR SPINE WITHOUT CONTRAST
TECHNIQUE: Multidetector CT imaging of the lumbar spine was performed without
intravenous contrast administration. Multiplanar CT image
reconstructions were also generated.

[Series 4: l spine soft · axial · 0.28mm/px · z∈[-524,-372]mm · 5 of 110 slices shown, 7 images]
[im 17/110  soft-tissue]
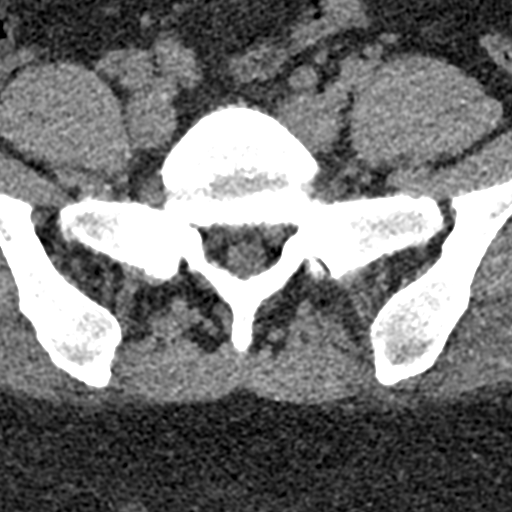
[im 17/110  bone]
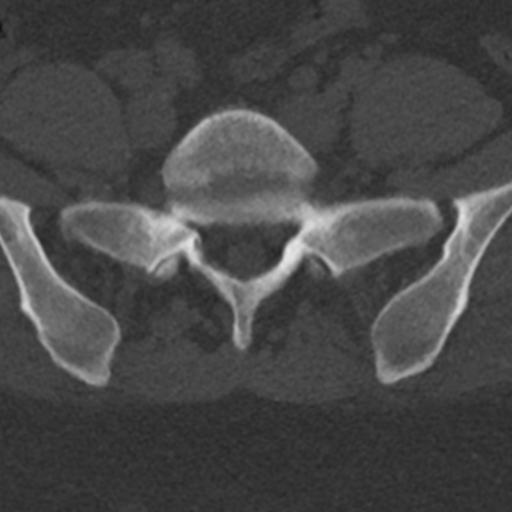
[im 34/110  bone]
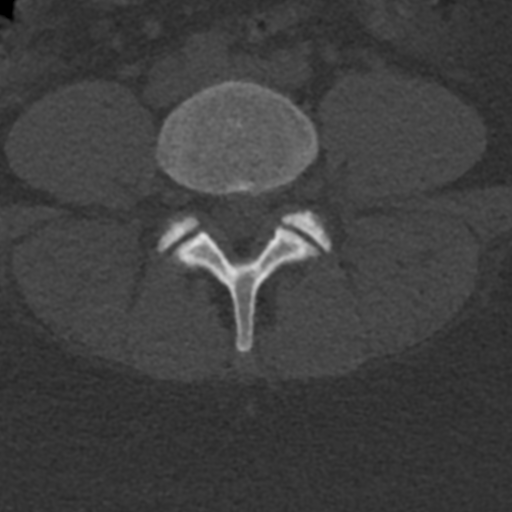
[im 59/110  bone]
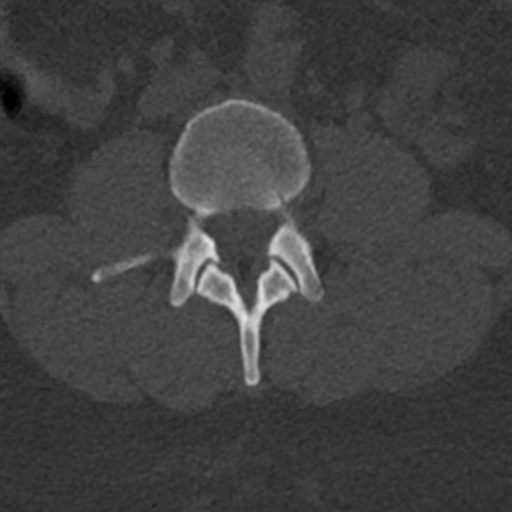
[im 76/110  bone]
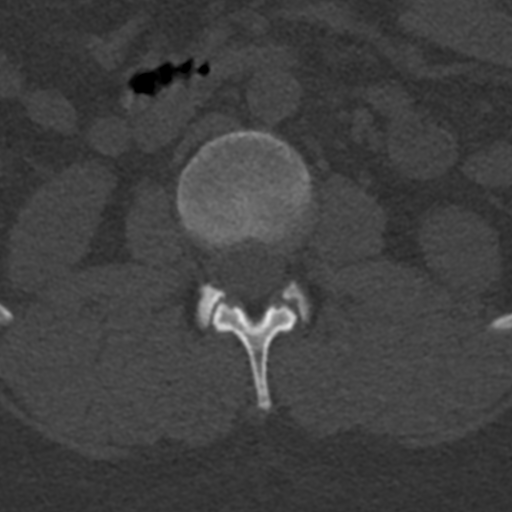
[im 93/110  soft-tissue]
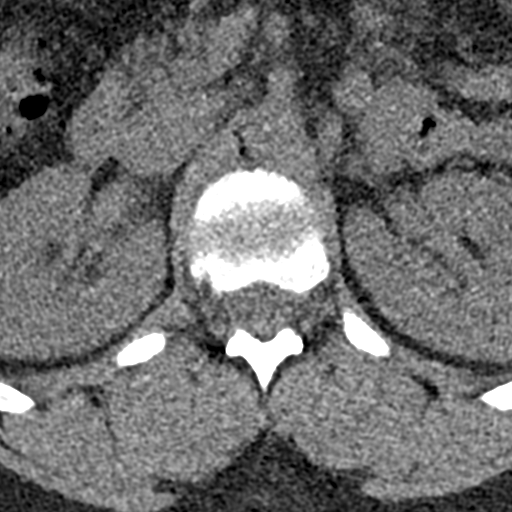
[im 93/110  bone]
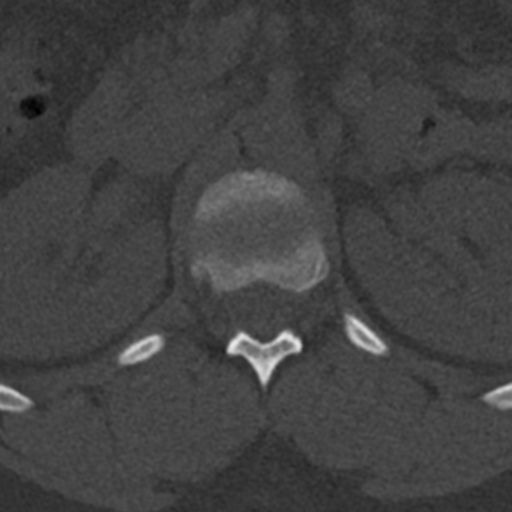

[Series 5: sagittal bone · sagittal · 0.31mm/px · 5 of 61 slices shown, 6 images]
[im 21/61  bone]
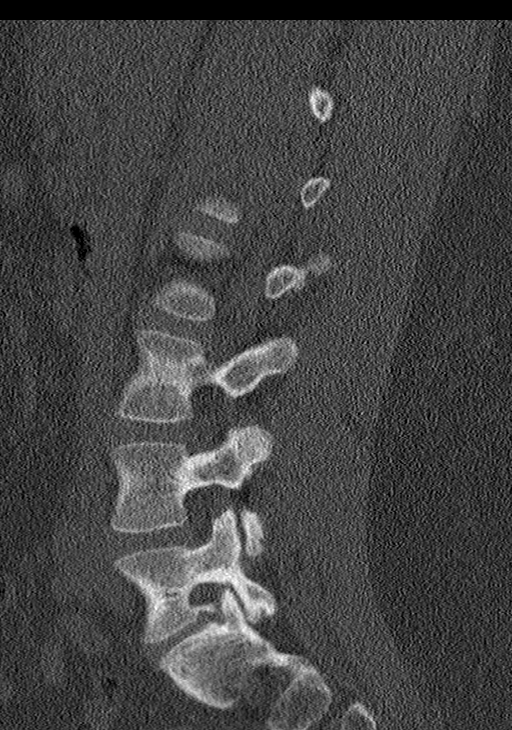
[im 26/61  bone]
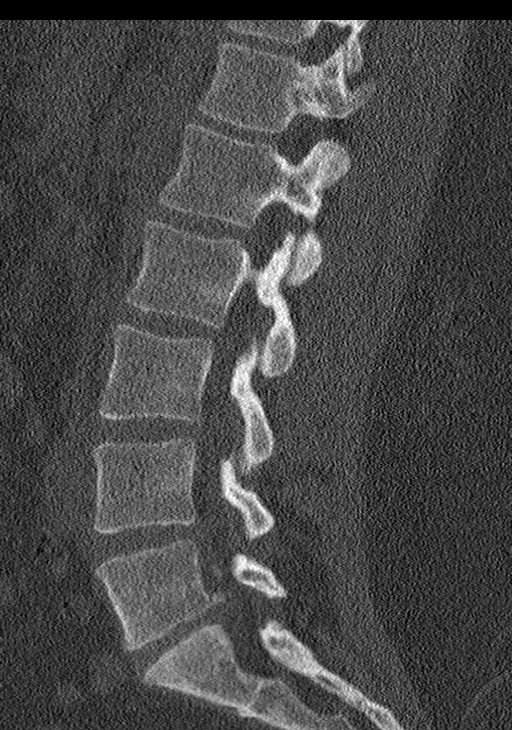
[im 31/61  soft-tissue]
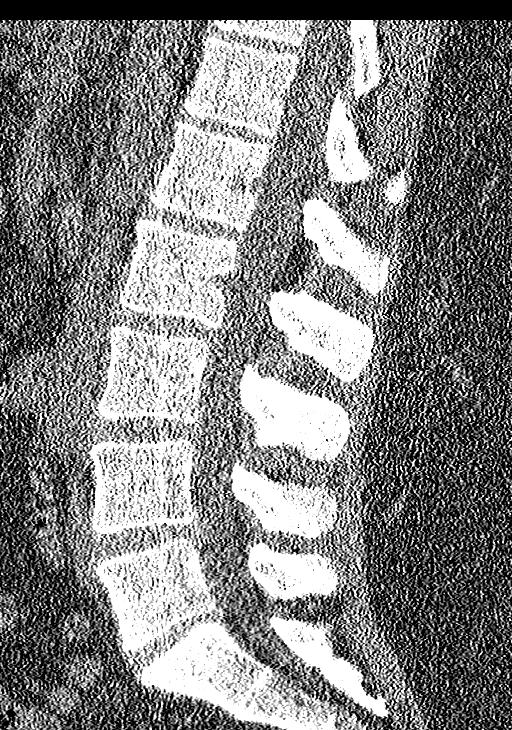
[im 31/61  bone]
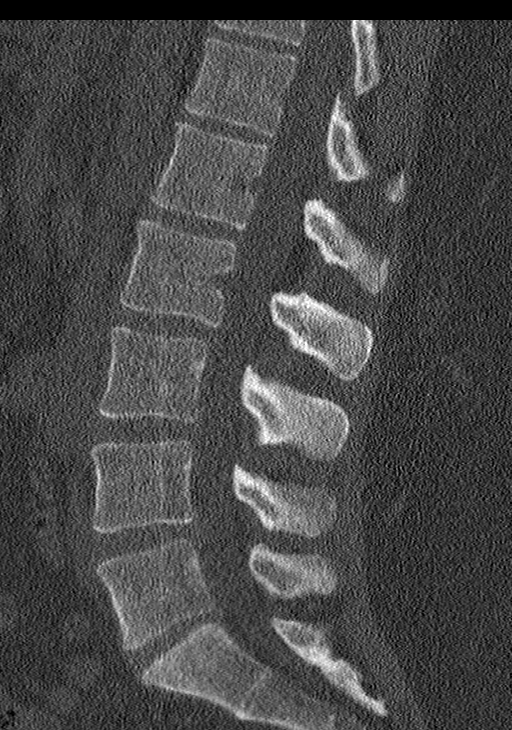
[im 36/61  bone]
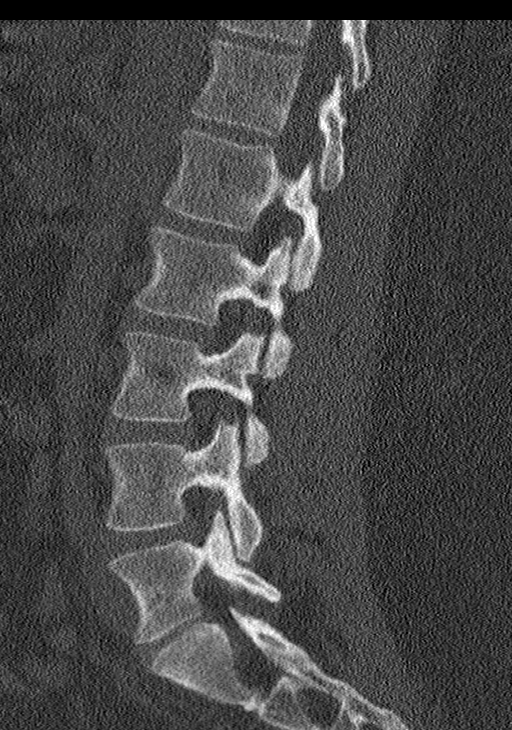
[im 41/61  bone]
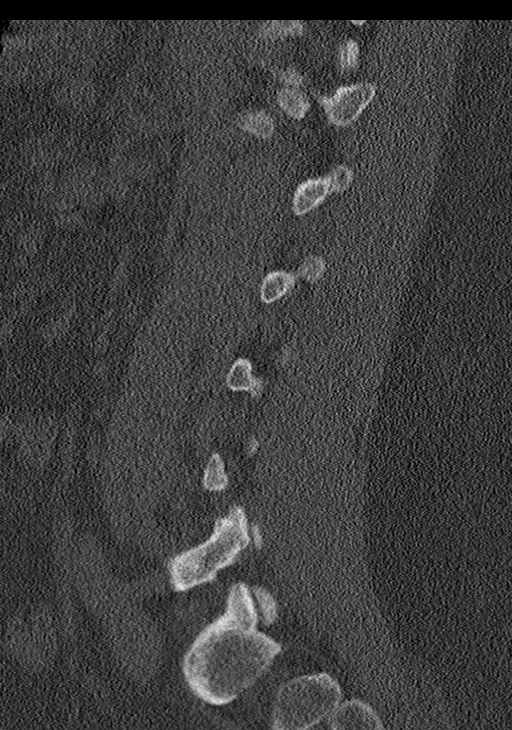

[Series 6: coronal bone · coronal · 0.32mm/px · 3 of 76 slices shown]
[im 16/76  bone]
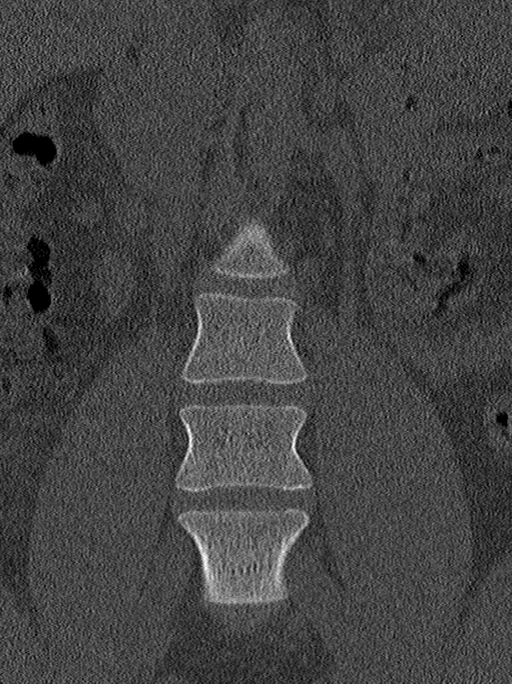
[im 31/76  bone]
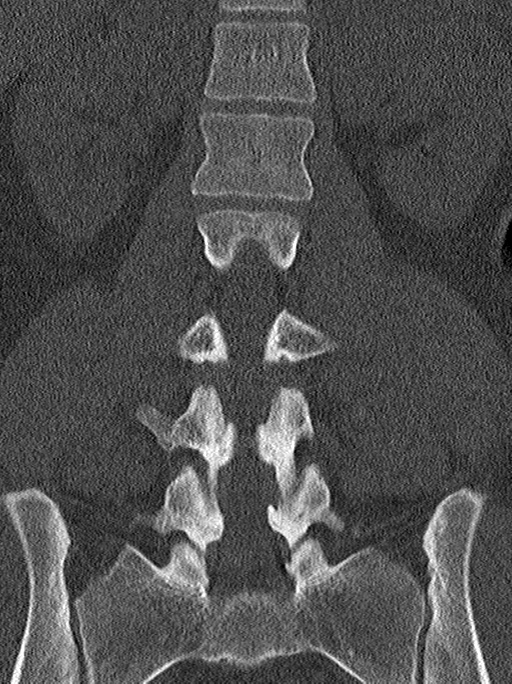
[im 46/76  bone]
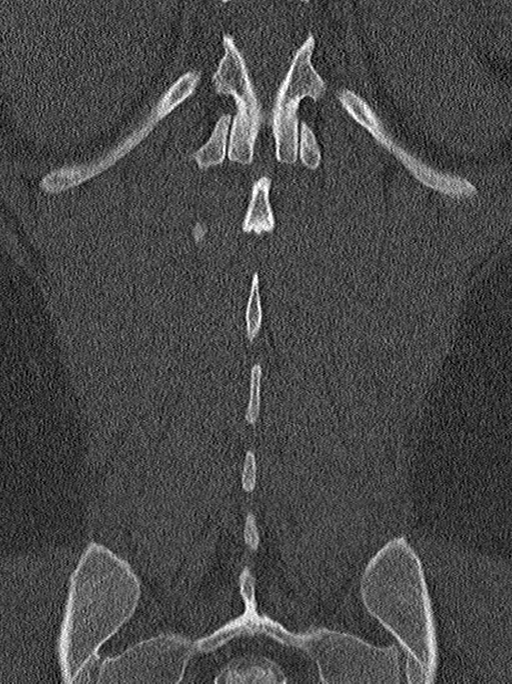

[13 of 33 positions shown; findings below may reference images not displayed]

FINDINGS: Segmentation: Normal.

Alignment: Normal lumbar lordosis.

Vertebrae: Bone mineralization is within normal limits. The
bilateral lumbar transverse processes are intact. Visible lower
posterior ribs appear intact. No lumbar vertebral fracture or No
acute osseous abnormality identified. Intact visible sacrum and SI
joints.

Paraspinal and other soft tissues: Negative visible noncontrast
abdominal viscera. Lumbar paraspinal soft tissues are within normal
limits.

Disc levels:

T11-T12: Negative.

T12-L1:  Negative.

L1-L2:  Negative.

L2-L3:  Negative.

L3-L4:  Minimal if any disc bulging.  Otherwise negative.

L4-L5:  Mild circumferential disc bulge.  No convincing stenosis.

L5-S1: Partially calcified central and rightward disc protrusion
(series 4, image 90) with endplate spurring. No significant spinal
stenosis, but there is at least mild right lateral recess stenosis
(right S1 nerve level), and moderate right L5 foraminal stenosis.
IMPRESSION: 1. No transverse process fracture or acute osseous abnormality in
the lumbar spine.
2. Partially calcified rightward disc herniation at L5-S1. Query
right L5 and/or S1 radiculitis.

## 2022-06-18 ENCOUNTER — Ambulatory Visit: Payer: Self-pay | Admitting: Radiology

## 2022-06-18 DIAGNOSIS — Z0289 Encounter for other administrative examinations: Secondary | ICD-10-CM

## 2022-06-20 ENCOUNTER — Ambulatory Visit: Payer: 59 | Admitting: Radiology
